# Patient Record
Sex: Female | Born: 1960 | Race: White | Hispanic: No | Marital: Single | Smoking: Never smoker
Health system: Southern US, Community
[De-identification: ages and names within clinical notes are randomized; demographics above are authoritative.]

## PROBLEM LIST (undated history)

## (undated) DIAGNOSIS — M1990 Unspecified osteoarthritis, unspecified site: Secondary | ICD-10-CM

## (undated) DIAGNOSIS — K219 Gastro-esophageal reflux disease without esophagitis: Secondary | ICD-10-CM

## (undated) DIAGNOSIS — E559 Vitamin D deficiency, unspecified: Secondary | ICD-10-CM

## (undated) DIAGNOSIS — D649 Anemia, unspecified: Secondary | ICD-10-CM

## (undated) DIAGNOSIS — Z9889 Other specified postprocedural states: Secondary | ICD-10-CM

## (undated) DIAGNOSIS — M8580 Other specified disorders of bone density and structure, unspecified site: Secondary | ICD-10-CM

## (undated) DIAGNOSIS — T8859XA Other complications of anesthesia, initial encounter: Secondary | ICD-10-CM

## (undated) DIAGNOSIS — J45909 Unspecified asthma, uncomplicated: Secondary | ICD-10-CM

## (undated) HISTORY — PX: TONSILLECTOMY: SUR1361

## (undated) HISTORY — PX: ADENOIDECTOMY: SUR15

## (undated) HISTORY — PX: KNEE ARTHROSCOPY: SUR90

## (undated) HISTORY — DX: Vitamin D deficiency, unspecified: E55.9

## (undated) HISTORY — DX: Unspecified asthma, uncomplicated: J45.909

## (undated) HISTORY — PX: COLONOSCOPY: SHX174

## (undated) HISTORY — DX: Other specified disorders of bone density and structure, unspecified site: M85.80

---

## 1994-12-02 DIAGNOSIS — J189 Pneumonia, unspecified organism: Secondary | ICD-10-CM

## 1994-12-02 HISTORY — DX: Pneumonia, unspecified organism: J18.9

## 1998-07-06 ENCOUNTER — Other Ambulatory Visit: Admission: RE | Admit: 1998-07-06 | Discharge: 1998-07-06 | Payer: Self-pay | Admitting: Obstetrics and Gynecology

## 1999-05-10 ENCOUNTER — Ambulatory Visit (HOSPITAL_COMMUNITY): Admission: RE | Admit: 1999-05-10 | Discharge: 1999-05-10 | Payer: Self-pay | Admitting: Internal Medicine

## 1999-07-16 ENCOUNTER — Other Ambulatory Visit: Admission: RE | Admit: 1999-07-16 | Discharge: 1999-07-16 | Payer: Self-pay | Admitting: Obstetrics and Gynecology

## 2000-07-25 ENCOUNTER — Other Ambulatory Visit: Admission: RE | Admit: 2000-07-25 | Discharge: 2000-07-25 | Payer: Self-pay | Admitting: Obstetrics and Gynecology

## 2000-07-25 ENCOUNTER — Encounter (INDEPENDENT_AMBULATORY_CARE_PROVIDER_SITE_OTHER): Payer: Self-pay

## 2001-08-19 ENCOUNTER — Other Ambulatory Visit: Admission: RE | Admit: 2001-08-19 | Discharge: 2001-08-19 | Payer: Self-pay | Admitting: Obstetrics and Gynecology

## 2004-03-27 ENCOUNTER — Other Ambulatory Visit: Admission: RE | Admit: 2004-03-27 | Discharge: 2004-03-27 | Payer: Self-pay | Admitting: Obstetrics and Gynecology

## 2004-04-02 ENCOUNTER — Encounter: Admission: RE | Admit: 2004-04-02 | Discharge: 2004-04-02 | Payer: Self-pay | Admitting: Obstetrics and Gynecology

## 2005-02-05 ENCOUNTER — Other Ambulatory Visit: Admission: RE | Admit: 2005-02-05 | Discharge: 2005-02-05 | Payer: Self-pay | Admitting: Obstetrics and Gynecology

## 2005-05-08 ENCOUNTER — Other Ambulatory Visit: Admission: RE | Admit: 2005-05-08 | Discharge: 2005-05-08 | Payer: Self-pay | Admitting: Obstetrics and Gynecology

## 2005-07-24 ENCOUNTER — Other Ambulatory Visit: Admission: RE | Admit: 2005-07-24 | Discharge: 2005-07-24 | Payer: Self-pay | Admitting: Obstetrics and Gynecology

## 2006-02-27 ENCOUNTER — Other Ambulatory Visit: Admission: RE | Admit: 2006-02-27 | Discharge: 2006-02-27 | Payer: Self-pay | Admitting: Obstetrics and Gynecology

## 2007-04-21 ENCOUNTER — Ambulatory Visit: Payer: Self-pay | Admitting: Internal Medicine

## 2007-04-21 LAB — CONVERTED CEMR LAB: FSH: 15.2 milliintl units/mL

## 2007-06-12 ENCOUNTER — Encounter: Admission: RE | Admit: 2007-06-12 | Discharge: 2007-06-12 | Payer: Self-pay | Admitting: Gastroenterology

## 2007-12-03 HISTORY — PX: PARTIAL HYSTERECTOMY: SHX80

## 2008-08-03 ENCOUNTER — Encounter (INDEPENDENT_AMBULATORY_CARE_PROVIDER_SITE_OTHER): Payer: Self-pay | Admitting: Obstetrics and Gynecology

## 2008-08-03 ENCOUNTER — Ambulatory Visit (HOSPITAL_COMMUNITY): Admission: RE | Admit: 2008-08-03 | Discharge: 2008-08-04 | Payer: Self-pay | Admitting: Obstetrics and Gynecology

## 2008-12-02 HISTORY — PX: BACK SURGERY: SHX140

## 2009-12-15 ENCOUNTER — Ambulatory Visit: Payer: Self-pay | Admitting: Vascular Surgery

## 2010-02-06 ENCOUNTER — Ambulatory Visit: Payer: Self-pay | Admitting: Vascular Surgery

## 2011-04-16 NOTE — Discharge Summary (Signed)
NAMESHEMEKIA, PATANE          ACCOUNT NO.:  [id number]   MEDICAL RECORD NO.:  [id number]          PATIENT TYPE:  OIB   LOCATION:  9311                          FACILITY:  [facility]   PHYSICIAN:  Guy Sandifer. Henderson Cloud, M.D. DATE OF BIRTH:  1961/01/25   DATE OF ADMISSION:  08/03/2008  DATE OF DISCHARGE:  08/04/2008                               DISCHARGE SUMMARY   ADMITTING DIAGNOSIS:  Uterine leiomyomata.   DISCHARGE DIAGNOSIS:  Uterine leiomyomata.   PROCEDURE:  On August 03, 2008, laparoscopically-assisted vaginal  hysterectomy.   REASON FOR ADMISSION:  The patient is a 50 year old single white female,  G0, P0 with increasingly symptomatic uterine leiomyomata.  Details  dictated in the history and physical.  She is admitted for surgical  management.   HOSPITAL COURSE:  The patient is admitted to the hospital and undergoes  the above procedure.  Estimated blood loss was 100 mL.  On the evening  of surgery, she has good pain relief.  Vital signs are stable.  She is  afebrile with a clear urine output.  On the day of discharge; she is  ambulating, tolerating a regular diet, and voiding.  Vital signs are  stable and she is afebrile.  Hemoglobin is 10.9 and pathology is  pending.   CONDITION ON DISCHARGE:  Good.   DIET:  Regular as tolerated.   ACTIVITY:  No lifting, no operation of automobiles, and no vaginal  entry.   She is to call the office for problems including not limited to  temperature of 101 degrees, heavy vaginal bleeding, increasing pain, or  persistent nausea and vomiting.   MEDICATIONS:  1. Percocet 5/325 mg #30 1-2 p.o. q.6 h p.r.n., no refills.  2. Cataflam b.i.d. as she took preoperatively for her back.  3. Multivitamin daily.   FOLLOWUP:  In the office in 2 weeks.      Guy Sandifer Henderson Cloud, M.D.  Electronically Signed     JET/MEDQ  D:  08/04/2008  T:  08/04/2008  Job:  [id number]

## 2011-04-16 NOTE — Op Note (Signed)
Allison Beck, Allison Beck          ACCOUNT NO.:  [id number]   MEDICAL RECORD NO.:  [id number]          PATIENT TYPE:  OIB   LOCATION:  9311                          [facility]ILITY[facility]   PHYSICIAN:  Guy Sandifer. Henderson Cloud, M.D. DATE OF BIRTH:  26-Nov-1961   DATE OF PROCEDURE:  08/03/2008  DATE OF DISCHARGE:                               OPERATIVE REPORT   PREOPERATIVE DIAGNOSIS:  Uterine leiomyomata.   POSTOPERATIVE DIAGNOSIS:  Uterine leiomyomata.   PROCEDURE:  Laparoscopically-assisted vaginal hysterectomy.   SURGEON:  Guy Sandifer. Henderson Cloud, MD   ASSISTANT:  Juluis Mire, MD   ANESTHESIA:  General with endotracheal intubation.   ESTIMATED BLOOD LOSS:  100 mL.   SPECIMENS:  Uterus to pathology.   INDICATIONS AND CONSENT:  This patient is a 50 year old single white  female G0, P0 with increasingly symptomatic uterine leiomyomata.  Details are dictated in the history and physical.  Laparoscopically-  assisted vaginal hysterectomy and removal of one or both ovaries, only  if abnormal, is discussed.  Potential risks and complications have been  discussed preoperatively including, but not limited to infection, organ  damage, bleeding requiring transfusion of blood products with possible  HIV and hepatitis acquisition, DVT, PE, pneumonia, fistula formation,  postoperative dyspareunia, and laparotomy.  All questions have been  answered and consent is signed on the chart.   FINDINGS:  Upper abdomen is grossly normal.  There are some filmy  adhesions at the epiploic arcade to the left ovary.  However, the  ovaries are otherwise normal in appearance and free.  Uterus has a 5- x  4-cm intramural leiomyoma.  Anterior and posterior cul-de-sacs are  normal.   PROCEDURE:  The patient was taken to the operating room, where she was  identified and placed in the dorsal supine position.  She was placed in  the dorsal lithotomy position prior to anesthesia secondary to her  history of low back pain.   General anesthesia was then induced via  endotracheal intubation.  The patient was then prepped abdominally and  vaginally.  Bladder was straight catheterized.  Hulka tenaculum was  placed in the uterus as a manipulator and she was draped in a sterile  fashion.  The infraumbilical and suprapubic areas were injected in the  midline with 0.5% plain Marcaine.  An infraumbilical incision was made.  The disposable Veress needle was placed on the first attempt and a  normal syringe and drop tests were noted.  A 2 L of gas were insufflated  under low pressure with good tympany in the right upper quadrant.  Veress needle was removed and a 10/11 XL bladeless disposable trocar  sleeve was placed using direct visualization with the diagnostic  laparoscope.  After placement, the operative laparoscope was used.  A  small suprapubic incision was made in the midline and a 5-mm XL  bladeless disposable trocar sleeve was placed under direct visualization  without difficulty.  The above findings were noted.  Then, using the  EnSeal cautery cutting device, proximal ligaments were taken down to the  level of the vesicouterine peritoneum bilaterally.  Good hemostasis was  maintained.  Vesicouterine peritoneum  was taken down cephalad laterally.  Good hemostasis was again noted.  The suprapubic trocar sleeve was  removed.  All other instruments were removed and attention was turned to  the vagina.  The posterior cul-de-sac was entered sharply with scissors.  Cervix was circumscribed with unipolar cautery.  Mucosa was advanced  sharply and bluntly.  Initially, the handheld EnSeal device was used.  However, this proved to be inadequate and the LigaSure cautery device  was then used to take down the uterosacral ligaments followed by the  bladder pillars, cardinal ligaments, and uterine vessels bilaterally.  Secondary to the narrow vagina, the cervix was cored out under careful  visualization.  This exposed the  larger posterior fundal fibroid.  This  was brought into the field and a myomectomy was done to debulk this.  This then allowed the remainder of the fundus to be delivered  posteriorly in the standard fashion.  The remaining pedicles were taken  down with LigaSure bilaterally.  The pedicle on the left was ligated  with a free tie as well.  Uterosacral ligaments were then plicated at  the vaginal cuff bilaterally with individual sutures of 0-Monocryl.  All  sutures will be 0-Monocryl unless otherwise designated.  Uterosacral  ligaments were then plicated in the midline with a third suture.  Cuff  was closed with figure-of-eight.  Foley catheter was placed in the  bladder and clear urine was noted.  Attention was returned to the  abdomen.  Pneumoperitoneum was reintroduced, and the suprapubic trocar  sleeve was reintroduced under direct visualization.  Irrigation was  carried out.  Minor bleeding at peritoneal edges was controlled with  bipolar cautery.  Inspection of the reduced pneumoperitoneum revealed  continued hemostasis.  All instruments were removed and trocar sleeves  were removed.  A 0-Vicryl suture was used to reapproximate the  subcutaneous tissues and the umbilical incision under good  visualization.  Skin on both incisions was closed with interrupted 2-0  Vicryl.  Dermabond was applied.  All counts were correct, and the  patient was awakened and taken to the recovery room in stable condition.      Guy Sandifer Henderson Cloud, M.D.  Electronically Signed     JET/MEDQ  D:  08/03/2008  T:  08/04/2008  Job:  [id number]

## 2011-04-16 NOTE — H&P (Signed)
NAMELAKASHIA, COLLISON          ACCOUNT NO.:  [id number]   MEDICAL RECORD NO.:  [id number]        PATIENT TYPE:  WOIB   LOCATION:                                [facility]   PHYSICIAN:  Guy Sandifer. Henderson Cloud, M.D. DATE OF BIRTH:  03-07-1961   DATE OF ADMISSION:  08/03/2008  DATE OF DISCHARGE:                              HISTORY & PHYSICAL   DATE OF ADMISSION:  08/03/2008.   CHIEF COMPLAINT:  Uterine leiomyomata.   HISTORY OF PRESENT ILLNESS:  This patient is a 50 year old single white  female G0, P0 with known uterine leiomyomata who is having increasingly  heavy and irregular menses.  Ultrasound in my office on May 03, 2008  revealed a uterus measuring 8.5 x 4.5 x 6.6 cm.  There are at least 2  uterine leiomyomata measuring 5 and 4.9 cm.  The ovaries appeared  normal.  Attempts at controlling her bleeding with both the IUD and the  birth control pill have been unsuccessful.  She also is noted to have a  Pap smear that was atypical in May of this year.  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  After discussion of options, she is  being admitted for laparoscopically-assisted vaginal hysterectomy.  The  ovaries will be retained if they appear normal.  The potential risks and  complications have been discussed preoperatively.   PAST MEDICAL HISTORY:  1. Abnormal Pap smear.  2. Migraine headaches.  3. Asthma.  4. Irritable bowel syndrome.  5. Osteoporosis.  6. Arthritis.   PAST SURGICAL HISTORY:  Arthroscopy.   FAMILY HISTORY:  Positive for heart disease, ulcer disease, thyroid  disease, osteoporosis and arthritis.   MEDICATIONS:  Diclofenac 50 mg, Neurontin 600 mg, pantoprazole 40 mg,  Pulmicort 180 mcg, nortriptyline 30 mg, clonazepam 1 mg.   ALLERGIES:  SULFA AND PENICILLIN LEADING TO UNKNOWN REACTION, MAXAIR  LEADING TO HEART RACING, AVELOX LEADING TO YEAST INFECTIONS.   SOCIAL HISTORY:  Denies tobacco, alcohol or drug abuse.   REVIEW OF SYSTEMS:   NEURO:  Headache as above.  CARDIO:  Denies chest  pain.  PULMONARY:  History of asthma.  GI: History of irritable bowel  syndrome.   PHYSICAL EXAM:  Height 5 feet 2-1/2 inches, weight 124.2 pounds, blood  pressure 110/62.  HEENT: Without thyromegaly.  LUNGS:  Clear to auscultation.  HEART:  Regular rate and rhythm.  BACK:  Without CVA tenderness.  BREASTS:  Without mass, retraction or discharge.  PELVIC EXAM:  Vulva, vagina, and cervix without lesion.  The uterus is  about 10 weeks in size, mobile, nontender.  Adnexa nontender without  masses.  EXTREMITIES:  Grossly within normal limits.  NEUROLOGICAL EXAM:  Grossly within normal limits.   ASSESSMENT:  Uterine leiomyomata.   PLAN:  Laparoscopically-assisted vaginal hysterectomy.      Guy Sandifer Henderson Cloud, M.D.  Electronically Signed     JET/MEDQ  D:  08/02/2008  T:  08/02/2008  Job:  [id number]

## 2011-04-16 NOTE — Consult Note (Signed)
NEW PATIENT CONSULTATION   Allison Beck, Allison Beck  DOB:  06/23/1961                                       02/06/2010  ZOXWR#:[id number]   The patient is a 50 year old female referred by Dr. Elisabeth Most for  possible lower extremity occlusive disease.  The patient gives a history  of multiple complaints regarding her lower and upper extremities.  She  describes a sensation of pins and needles as well as a burning sensation  in both lower extremities, worse on the left than the right, generally  in the thigh and calf area but not involving the feet.  The right side  is more related to the calf area.  These symptoms are not related to  activity or rest.  She has had no history of rest pain or nonhealing  ulcers in the feet or infection.  She states that she had a fusion of L5-  S1 performed in [place] by Dr. Montez Morita, orthopedic surgeon, and that there  were similar symptoms in her legs that she had preoperatively.  She is  scheduled to see him next month.  She works out 3 days a week at [employer]  but does not ambulate more than 1 block she states.   CHRONIC MEDICAL PROBLEMS:  Which are stable:  1. Degenerative joint disease.  2. Asthma, borderline.  3. Negative for diabetes, hypertension, coronary artery disease or      stroke.   FAMILY HISTORY:  Positive for coronary artery disease and stroke in her  mother.  Negative for diabetes.   SOCIAL HISTORY:  She is single, has no children.  Does not work.  She  does not use tobacco or alcohol.   REVIEW OF SYSTEMS:  Positive for heart murmur, chronic bronchitis,  asthma, history of reflux esophagitis with hiatal hernia, urinary  frequency, headaches, arthritis and anemia.  All other systems are  negative.   PHYSICAL EXAM:  Vital signs:  Blood pressure 132/77, heart rate 96,  temperature 97.9.  General:  She is well-developed, well-nourished  female who is in no apparent distress, alert and oriented x3.  HEENT:  EOMs intact.   Conjunctivae normal.  Lungs:  Are clear to auscultation.  No wheezing.  Cardiovascular:  Regular rhythm.  No murmurs.  Carotid  pulses are 3+.  No audible bruits.  No palpable adenopathy.  Abdomen:  Soft, nontender with no masses.  Musculoskeletal:  Free of major  deformities or cyanosis.  Neurological:  Normal.  Skin:  Free of rashes.  Lower extremity:  Exam reveals 3+ femoral, popliteal, dorsalis pedis,  posterior tibial pulses bilaterally with no evidence of ischemia or  infection.   I reviewed her previous vascular lab studies which we interpreted and  she does have essentially normal pressures in both lower extremities  with ABI on the right being 1 and on the left being 0.87.   I do not think her symptoms are consistent with arterial insufficiency  and her vascular lab studies support this.  I think it sounds as though  she has a nerve compression syndrome and she should be reevaluated by  her orthopedic surgeon with possible repeat MRI scan of the back and if  this does not result in answers possibly a neurology consultation.  If I  can be of further assistance please let me know.     Quita Skye  Hart Rochester, M.D.  Electronically Signed   JDL/MEDQ  D:  02/06/2010  T:  02/07/2010  Job:  [id number]   cc:   Lovenia Kim, D.O.

## 2011-09-03 ENCOUNTER — Other Ambulatory Visit: Payer: Self-pay | Admitting: Neurological Surgery

## 2011-09-03 DIAGNOSIS — M5410 Radiculopathy, site unspecified: Secondary | ICD-10-CM

## 2011-09-04 LAB — COMPREHENSIVE METABOLIC PANEL
ALT: 44 — ABNORMAL HIGH
AST: 33
Albumin: 3.9
Alkaline Phosphatase: 54
BUN: 13
Total Bilirubin: 0.6
Total Protein: 6.7

## 2011-09-04 LAB — CBC
HCT: 32 — ABNORMAL LOW
Hemoglobin: 12.9
MCHC: 34.2
MCV: 90.4
Platelets: 257
RDW: 12
RDW: 12.4
WBC: 7.6

## 2011-09-04 LAB — HCG, SERUM, QUALITATIVE: Preg, Serum: NEGATIVE

## 2011-09-12 ENCOUNTER — Other Ambulatory Visit: Payer: Self-pay | Admitting: Neurological Surgery

## 2011-09-12 DIAGNOSIS — M545 Low back pain: Secondary | ICD-10-CM

## 2011-09-17 ENCOUNTER — Ambulatory Visit
Admission: RE | Admit: 2011-09-17 | Discharge: 2011-09-17 | Disposition: A | Discharge status: Home / Self Care | Payer: Self-pay | Source: Ambulatory Visit | Attending: Neurological Surgery | Admitting: Neurological Surgery

## 2011-09-17 DIAGNOSIS — M545 Low back pain: Secondary | ICD-10-CM

## 2012-08-06 ENCOUNTER — Other Ambulatory Visit: Payer: Self-pay | Admitting: Neurological Surgery

## 2012-08-06 DIAGNOSIS — M545 Low back pain: Secondary | ICD-10-CM

## 2012-08-07 ENCOUNTER — Ambulatory Visit
Admission: RE | Admit: 2012-08-07 | Discharge: 2012-08-07 | Disposition: A | Discharge status: Home / Self Care | Payer: No Typology Code available for payment source | Source: Ambulatory Visit | Attending: Neurological Surgery | Admitting: Neurological Surgery

## 2012-08-07 DIAGNOSIS — M545 Low back pain: Secondary | ICD-10-CM

## 2012-08-10 ENCOUNTER — Other Ambulatory Visit: Payer: Self-pay | Admitting: Neurological Surgery

## 2012-08-10 DIAGNOSIS — M545 Low back pain: Secondary | ICD-10-CM

## 2013-05-07 IMAGING — CT CT L SPINE W/O CM
3 of 9 series · 10 of 28 positions shown, 11 images · non-contrast
Comparison: CT scan of the abdomen and pelvis dated 11/20/2011 and
lumbar MRI dated 09/17/2011

CLINICAL DATA: Lumbago.  Tingling in the left leg.

CT LUMBAR SPINE WITHOUT CONTRAST
TECHNIQUE: Multidetector CT imaging of the lumbar spine was
performed without intravenous contrast administration. Multiplanar
CT image reconstructions were also generated.

[Series 2: l spine bone · axial · 0.27mm/px · z∈[-32,+34]mm · 2 of 80 slices shown, 3 images]
[im 27/80  soft-tissue]
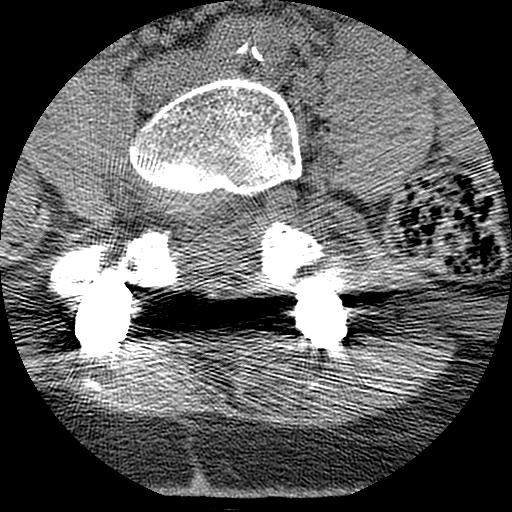
[im 27/80  bone]
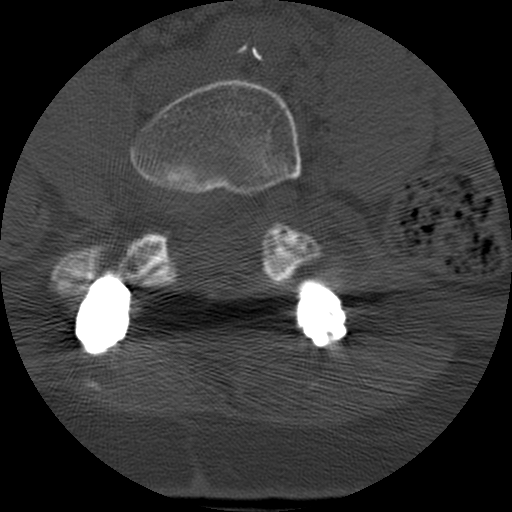
[im 53/80  bone]
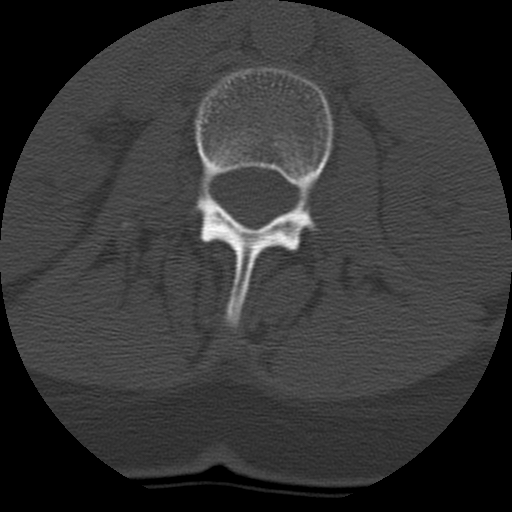

[Series 3: l spine soft · axial · 0.27mm/px · z∈[-49,+51]mm · 3 of 80 slices shown]
[im 20/80  soft-tissue]
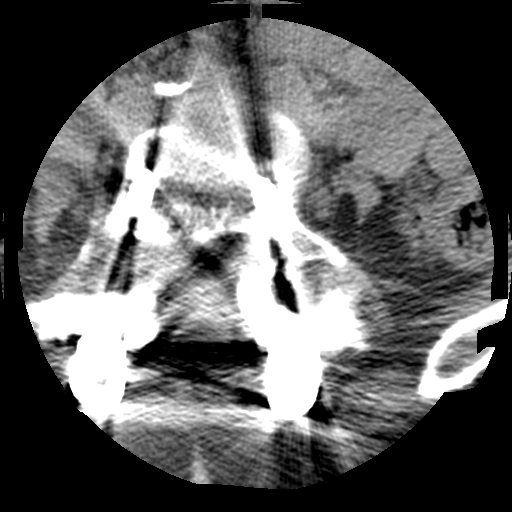
[im 40/80  soft-tissue]
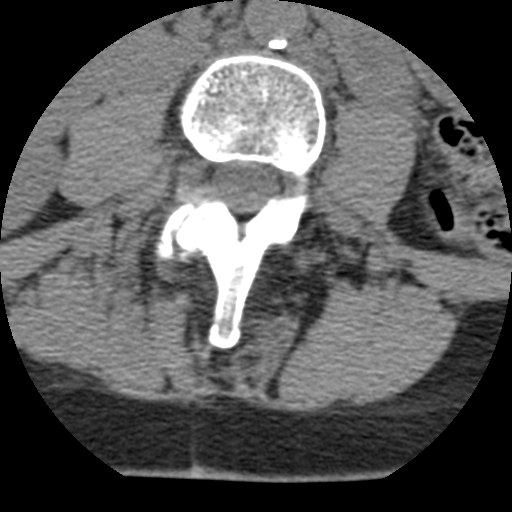
[im 60/80  soft-tissue]
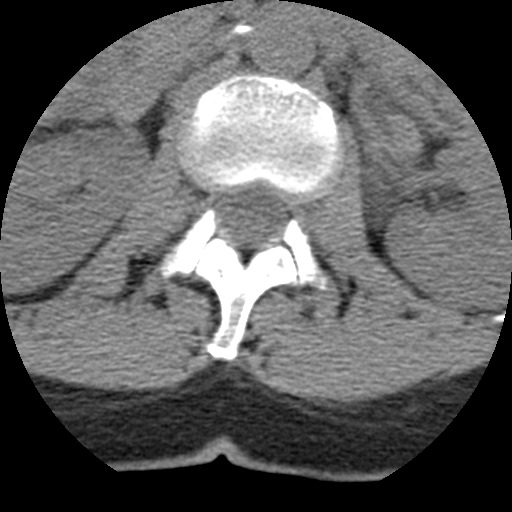

[Series 400: cor · coronal · 0.39mm/px · 5 of 48 slices shown]
[im 8/48  bone]
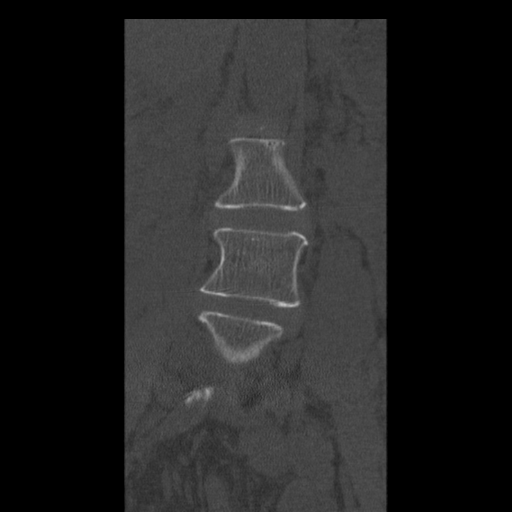
[im 16/48  bone]
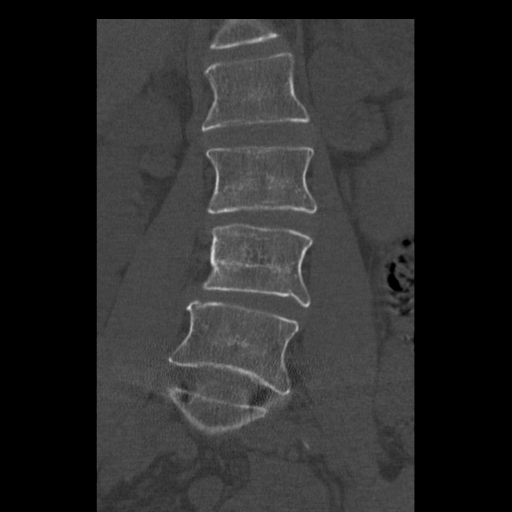
[im 24/48  bone]
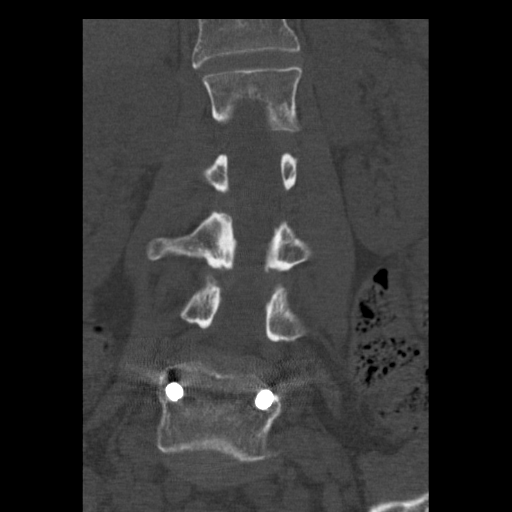
[im 32/48  bone]
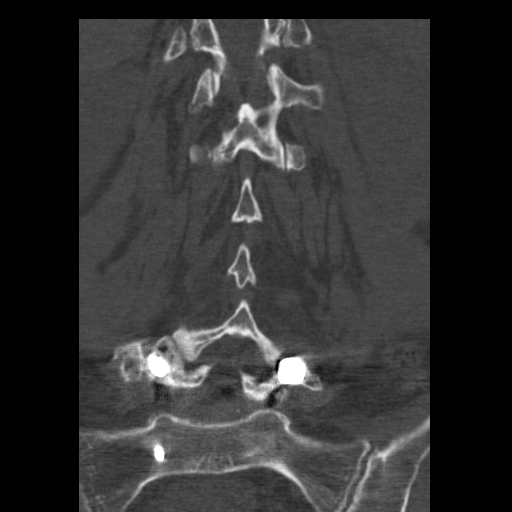
[im 40/48  bone]
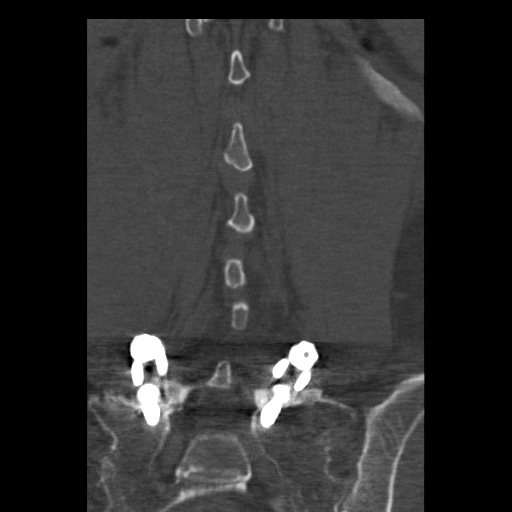

[10 of 28 positions shown; findings below may reference images not displayed]

FINDINGS: T12-L1 through L2-3:  Normal.

L3-4:  There is an asymmetric bulge of the disc into the right
lateral recess and right neural foramen without impingement upon
the exiting [DATE] nerve.  Right lateral recess is slightly
compressed but appears essentially unchanged since the prior CT
scan dated 11/20/2011.  Moderate degenerative changes of the right
facet joint.

L4-5:  Small broad-based disc bulge with no impingement.  Moderate
left facet arthritis.

L5-S1: Solid posterior fusion.  Pedicle screws are in good
position.  Excellent posterior decompression of the thecal sac.
Broad-based slight bulge of the uncovered disc.
IMPRESSION: 1.  Asymmetric bulge of the L3-4 disc with slight narrowing of the
right lateral recess, unchanged since 11/20/2011.
[DATE]. Solid posterior fusion at L5-S1 with no significant residual
impingement.

## 2014-09-26 DIAGNOSIS — J45909 Unspecified asthma, uncomplicated: Secondary | ICD-10-CM | POA: Insufficient documentation

## 2014-09-26 DIAGNOSIS — E559 Vitamin D deficiency, unspecified: Secondary | ICD-10-CM | POA: Insufficient documentation

## 2014-09-26 DIAGNOSIS — M810 Age-related osteoporosis without current pathological fracture: Secondary | ICD-10-CM | POA: Insufficient documentation

## 2014-09-27 ENCOUNTER — Other Ambulatory Visit (HOSPITAL_COMMUNITY)
Admission: RE | Admit: 2014-09-27 | Discharge: 2014-09-27 | Disposition: A | Discharge status: Home / Self Care | Payer: No Typology Code available for payment source | Source: Ambulatory Visit | Attending: Physician Assistant | Admitting: Physician Assistant

## 2014-09-27 ENCOUNTER — Ambulatory Visit (INDEPENDENT_AMBULATORY_CARE_PROVIDER_SITE_OTHER): Payer: No Typology Code available for payment source | Admitting: Physician Assistant

## 2014-09-27 ENCOUNTER — Encounter: Payer: Self-pay | Admitting: Physician Assistant

## 2014-09-27 VITALS — BP 110/60 | HR 76 | Ht 63.0 in

## 2014-09-27 DIAGNOSIS — Z01419 Encounter for gynecological examination (general) (routine) without abnormal findings: Secondary | ICD-10-CM | POA: Diagnosis present

## 2014-09-27 DIAGNOSIS — Z1151 Encounter for screening for human papillomavirus (HPV): Secondary | ICD-10-CM | POA: Insufficient documentation

## 2014-09-27 DIAGNOSIS — R6889 Other general symptoms and signs: Secondary | ICD-10-CM

## 2014-09-27 DIAGNOSIS — Z0001 Encounter for general adult medical examination with abnormal findings: Secondary | ICD-10-CM

## 2014-09-27 DIAGNOSIS — Z23 Encounter for immunization: Secondary | ICD-10-CM

## 2014-09-27 DIAGNOSIS — N898 Other specified noninflammatory disorders of vagina: Secondary | ICD-10-CM

## 2014-09-27 DIAGNOSIS — Z124 Encounter for screening for malignant neoplasm of cervix: Secondary | ICD-10-CM

## 2014-09-27 DIAGNOSIS — N760 Acute vaginitis: Secondary | ICD-10-CM | POA: Diagnosis not present

## 2014-09-27 DIAGNOSIS — E785 Hyperlipidemia, unspecified: Secondary | ICD-10-CM

## 2014-09-27 MED ORDER — AMITRIPTYLINE HCL 50 MG PO TABS: 50.0000 mg | ORAL_TABLET | Freq: Every day | ORAL | Status: DC

## 2014-09-27 NOTE — Patient Instructions (Signed)

## 2014-09-27 NOTE — Progress Notes (Signed)
Complete Physical  Assessment and Plan: Asthma- remission/controlled  Vitamin D deficiency- check level  Osteoporosis- continue follow up Dr. Huntley Decomlin, cont vitamin D/Ca, weight bearing exercises.   Chronic back pain PAP- + discharge check BV/yeast, will send results to Dr. Huntley Decomlin Health Maintenance  Discussed med's effects and SE's. Screening labs and tests as requested with regular follow-up as recommended.  HPI  53 y.o. female  presents for a complete physical.   Her blood pressure has been controlled at home, today their BP is BP: 110/60 mmHg She does workout. She denies chest pain, shortness of breath, dizziness.  She is not on cholesterol medication and denies myalgias. Her cholesterol is at goal. The cholesterol last visit was:   Patient is on Vitamin D supplement.  Has osteoporosis, not on any meds, follows with Dr. Huntley Decomlin.   Current Medications:  No current outpatient prescriptions on file prior to visit.   No current facility-administered medications on file prior to visit.   Health Maintenance:   Tetanus: unknown DUE Pneumovax: Flu vaccine:declines Zostavax: LMP: P. Hysterectomy 2009 Pap: Dr. Huntley Decomlin  Nov 2014 MGM: 2014 DEXA: nov 2013 with Dr. Huntley Dectomlin Colonoscopy: 09/2009 AT [facility], Dr. Jessee Aversellon EGD: Last Dental Exam: Dr. Dahlia ClientHannah Last Eye Exam: Dr. Ezequiel Kayserune  Patient Care Team: Lucky CowboyWilliam McKeown, MD as PCP - General (Internal Medicine) Pryor OchoaJames D Lawson, MD as Consulting Physician (Vascular Surgery) Leslie AndreaJames E Tomblin II, MD as Consulting Physician (Obstetrics and Gynecology) Tia Alertavid S Jones, MD as Consulting Physician (Neurosurgery) Petra KubaMarc E Magod, MD as Consulting Physician (Gastroenterology)  Allergies:  Allergies  Allergen Reactions  . Asa [Aspirin] Shortness Of Breath  . Sulfa Antibiotics Rash   Medical History:  Past Medical History  Diagnosis Date  . Asthma   . Vitamin D deficiency   . Osteopenia     2013 DEXA   Surgical History:  Past Surgical History  Procedure  Laterality Date  . Back surgery  2010    Fusion   . Tonsillectomy      Childhood  . Knee arthroscopy Right   . Partial hysterectomy  2009   Family History:  Family History  Problem Relation Age of Onset  . Heart disease Mother   . Stroke Mother   . Depression Mother   . Hypertension Mother   . Alzheimer's disease Father   . Hypertension Sister    Social History:  History  Substance Use Topics  . Smoking status: Never Smoker   . Smokeless tobacco: Never Used  . Alcohol Use: No   Review of Systems: [X]  = complains of  [ ]  = denies  General: Fatigue [ ]  Fever [ ]  Chills [ ]  Weakness [ ]   Insomnia [ ] Weight change [ ]  Night sweats [ ]   Change in appetite [ ]  Head: Head Trauma [ ]  Eyes: Wears glasses or corrective lens [ ]  Redness [ ]  Blurred vision [ ]  Diplopia [ ]  Discharge [ ]  Floaters [ ]  NWG:NFAOZHYENT:Earache [ ]  hearing loss [ ]  Tinnitus [ ]  Ear Discharge [ ]   Congestion [ ]  Sinus Pain [ ]  Post Nasal Drip [ ]  Nose Bleeds [ ]  Rhinorrhea [ ]    Difficulty Swallowing [ ]  Snoring [ ]  Sore Throat [ ]  Cardiac:   Chest pain/pressure [ ]  SOB [ ]  Orthopnea [ ]   Palpitations [ ]   Paroxysmal nocturnal dyspnea[ ]  Claudication [ ]  Edema [ ]  Difficulty walking around block or climbing stairs [ ]  Pulmonary: Cough [ ]  Wheezing[ ]   SOB [ ]   Pleurisy [ ]   Asthma [ ]  GI: Nausea [ ]  Vomiting[ ]  Dysphagia[ ]  Heartburn[ ]  Abdominal pain [ ]  Constipation [ ] ; Diarrhea [ ]  BRBPR [ ]  Melena[ ]  Bloating [ ]  Hemorrhoids [ ]  Incontinence [ ]  GU: Hematuria[ ]  Dysuria [ ]  Nocturia[ ]  Urgency [ ]   Hesitancy [ ]  Discharge [ ]  Frequency [ ]  Incontinence [ ]  Breast:  Dimpling [ ]  Breast lumps [ ]   Breast Lesions [ ]  Nipple discharge [ ]    Neuro: Headaches[ ]  Vertigo[ ]  Paresthesias[ ]  Spasm [ ]  Speech changes [ ]  Incoordination [ ]  Dizziness [ ]  Numbness [ ]  Ortho: Arthritis [ ]  Joint pain [ ]  Muscle pain [ ]  Joint swelling [ ]  Back Pain [ ]  Weakness [ ]  Stiffness [ ]  Skin:  Rash [ ]   Pruritis [ ]  Change in skin lesion [  ] Change in hair [ ]  Change in nails [ ]  Psych: Depression[ ]  Anxiety[ ]  Stress [ ]  Confusion [ ]  Memory loss [ ]   Heme/Lymph: Bleeding [ ]  Bruising [ ]  History of anemia [ ]  Enlarged lymph nodes [ ]   Endocrine: Visual blurring [ ]  Paresthesia [ ]  Polyuria [ ]  Polydipsia [ ]  Polyphagia [ ]   Heat/cold intolerance [ ]  Hypoglycemia [ ]  Thyroid Issues [ ]  Diabetes [ ]   Physical Exam: There is no weight on file to calculate BMI. BP 110/60  Pulse 76  Ht 5\' 3"  (1.6 m) General Appearance: Well nourished, in no apparent distress.  Eyes: PERRLA, EOMs, conjunctiva no swelling or erythema, normal fundi and vessels.  Sinuses: No Frontal/maxillary tenderness  ENT/Mouth: Ext aud canals clear, normal light reflex with TMs without erythema, bulging. Good dentition. No erythema, swelling, or exudate on post pharynx. Tonsils not swollen or erythematous. Hearing normal.  Neck: Supple, thyroid normal. No bruits  Respiratory: Respiratory effort normal, BS equal bilaterally without rales, rhonchi, wheezing or stridor.  Cardio: RRR without murmurs, rubs or gallops. Brisk peripheral pulses without edema.  Chest: symmetric, with normal excursions and percussion.  Breasts: Symmetric, without lumps, nipple discharge, retractions.  Abdomen: Soft, nontender, no guarding, rebound, hernias, masses, or organomegaly. .  Lymphatics: Non tender without lymphadenopathy.  Genitourinary: VULVA: normal appearing vulva with no masses, tenderness or lesions, VAGINA: atrophic, CERVIX: lesions absent, cervical discharge present - white, copious and creamy, PAP: Pap smear done today. Musculoskeletal: Full ROM all peripheral extremities,5/5 strength, and normal gait.  Skin: Warm, dry without rashes, lesions, ecchymosis. Neuro: Cranial nerves intact, reflexes equal bilaterally. Normal muscle tone, no cerebellar symptoms. Sensation intact.  Psych: Awake and oriented X 3, normal affect, Insight and Judgment appropriate.   EKG: WNL no  changes.   Quentin Mullingollier, Sedric Guia 3:33 PM [facility]

## 2014-09-28 LAB — BASIC METABOLIC PANEL WITH GFR
BUN: 20 mg/dL (ref 6–23)
CALCIUM: 10.2 mg/dL (ref 8.4–10.5)
CO2: 27 mEq/L (ref 19–32)
Chloride: 99 mEq/L (ref 96–112)
Creat: 0.74 mg/dL (ref 0.50–1.10)
GFR, Est African American: >89 mL/min
GFR, Est Non African American: >89 mL/min
Glucose, Bld: 89 mg/dL (ref 70–99)
Potassium: 4.1 mEq/L (ref 3.5–5.3)
SODIUM: 139 meq/L (ref 135–145)

## 2014-09-28 LAB — IRON AND TIBC
%SAT: 16 % — ABNORMAL LOW (ref 20–55)
IRON: 55 ug/dL (ref 42–145)
TIBC: 339 ug/dL (ref 250–470)
UIBC: 284 ug/dL (ref 125–400)

## 2014-09-28 LAB — LIPID PANEL
CHOL/HDL RATIO: 2.3 ratio
Cholesterol: 196 mg/dL (ref 0–200)
HDL: 86 mg/dL (ref >39)
LDL Cholesterol: 97 mg/dL (ref 0–99)
Triglycerides: 65 mg/dL (ref <150)
VLDL: 13 mg/dL (ref 0–40)

## 2014-09-28 LAB — HEMOGLOBIN A1C
HEMOGLOBIN A1C: 5.2 % (ref <5.7)
MEAN PLASMA GLUCOSE: 103 mg/dL (ref <117)

## 2014-09-28 LAB — HEPATIC FUNCTION PANEL
ALT: 29 U/L (ref 0–35)
AST: 30 U/L (ref 0–37)
Albumin: 4.9 g/dL (ref 3.5–5.2)
Alkaline Phosphatase: 58 U/L (ref 39–117)
BILIRUBIN DIRECT: 0.1 mg/dL (ref 0.0–0.3)
Indirect Bilirubin: 0.3 mg/dL (ref 0.2–1.2)
Total Bilirubin: 0.4 mg/dL (ref 0.2–1.2)
Total Protein: 7.3 g/dL (ref 6.0–8.3)

## 2014-09-28 LAB — INSULIN, FASTING: INSULIN FASTING, SERUM: 4.5 u[IU]/mL (ref 2.0–19.6)

## 2014-09-28 LAB — CBC WITH DIFFERENTIAL/PLATELET
BASOS ABS: 0.1 10*3/uL (ref 0.0–0.1)
Basophils Relative: 1 % (ref 0–1)
Eosinophils Absolute: 0.3 10*3/uL (ref 0.0–0.7)
Eosinophils Relative: 5 % (ref 0–5)
HEMATOCRIT: 42.6 % (ref 36.0–46.0)
Hemoglobin: 14.4 g/dL (ref 12.0–15.0)
LYMPHS ABS: 1.2 10*3/uL (ref 0.7–4.0)
LYMPHS PCT: 23 % (ref 12–46)
MCH: 29.8 pg (ref 26.0–34.0)
MCHC: 33.8 g/dL (ref 30.0–36.0)
MCV: 88 fL (ref 78.0–100.0)
MONO ABS: 0.6 10*3/uL (ref 0.1–1.0)
Monocytes Relative: 12 % (ref 3–12)
Neutro Abs: 3.1 10*3/uL (ref 1.7–7.7)
Neutrophils Relative %: 59 % (ref 43–77)
Platelets: 311 10*3/uL (ref 150–400)
RBC: 4.84 MIL/uL (ref 3.87–5.11)
RDW: 12.9 % (ref 11.5–15.5)
WBC: 5.2 10*3/uL (ref 4.0–10.5)

## 2014-09-28 LAB — URINALYSIS, ROUTINE W REFLEX MICROSCOPIC
BILIRUBIN URINE: NEGATIVE
GLUCOSE, UA: NEGATIVE mg/dL
Hgb urine dipstick: NEGATIVE
KETONES UR: NEGATIVE mg/dL
Leukocytes, UA: NEGATIVE
NITRITE: NEGATIVE
PH: 7 (ref 5.0–8.0)
Protein, ur: NEGATIVE mg/dL
Specific Gravity, Urine: <1.005 — ABNORMAL LOW (ref 1.005–1.030)
Urobilinogen, UA: 0.2 mg/dL (ref 0.0–1.0)

## 2014-09-28 LAB — VITAMIN B12: Vitamin B-12: >2000 pg/mL — ABNORMAL HIGH (ref 211–911)

## 2014-09-28 LAB — VITAMIN D 25 HYDROXY (VIT D DEFICIENCY, FRACTURES): VIT D 25 HYDROXY: 81 ng/mL (ref 30–89)

## 2014-09-28 LAB — MAGNESIUM: Magnesium: 2.4 mg/dL (ref 1.5–2.5)

## 2014-09-28 LAB — TSH: TSH: 3.878 u[IU]/mL (ref 0.350–4.500)

## 2014-09-28 LAB — FERRITIN: Ferritin: 58 ng/mL (ref 10–291)

## 2014-09-29 LAB — CYTOLOGY - PAP

## 2014-10-03 LAB — CERVICOVAGINAL ANCILLARY ONLY
Bacterial vaginitis: NEGATIVE
Candida vaginitis: NEGATIVE

## 2015-10-03 ENCOUNTER — Ambulatory Visit (INDEPENDENT_AMBULATORY_CARE_PROVIDER_SITE_OTHER): Payer: No Typology Code available for payment source | Admitting: Physician Assistant

## 2015-10-03 ENCOUNTER — Encounter: Payer: Self-pay | Admitting: Physician Assistant

## 2015-10-03 VITALS — BP 100/60 | HR 90 | Temp 98.1°F | Resp 14 | Ht 63.0 in | Wt 116.0 lb

## 2015-10-03 DIAGNOSIS — M8580 Other specified disorders of bone density and structure, unspecified site: Secondary | ICD-10-CM | POA: Diagnosis not present

## 2015-10-03 DIAGNOSIS — E559 Vitamin D deficiency, unspecified: Secondary | ICD-10-CM | POA: Diagnosis not present

## 2015-10-03 DIAGNOSIS — M5442 Lumbago with sciatica, left side: Secondary | ICD-10-CM

## 2015-10-03 DIAGNOSIS — J4520 Mild intermittent asthma, uncomplicated: Secondary | ICD-10-CM | POA: Diagnosis not present

## 2015-10-03 DIAGNOSIS — M5441 Lumbago with sciatica, right side: Secondary | ICD-10-CM | POA: Diagnosis not present

## 2015-10-03 DIAGNOSIS — M545 Low back pain, unspecified: Secondary | ICD-10-CM | POA: Insufficient documentation

## 2015-10-03 MED ORDER — AMITRIPTYLINE HCL 50 MG PO TABS: 50.0000 mg | ORAL_TABLET | Freq: Every day | ORAL | Status: DC

## 2015-10-03 NOTE — Patient Instructions (Signed)
What is the TMJ? The temporomandibular (tem-PUH-ro-man-DIB-yoo-ler) joint, or the TMJ, connects the upper and lower jawbones. This joint allows the jaw to open wide and move back and forth when you chew, talk, or yawn.There are also several muscles that help this joint move. There can be muscle tightness and pain in the muscle that can cause several symptoms.  What causes TMJ pain? There are many causes of TMJ pain. Repeated chewing (for example, chewing gum) and clenching your teeth can cause pain in the joint. Some TMJ pain has no obvious cause. What can I do to ease the pain? There are many things you can do to help your pain get better. When you have pain:  Eat soft foods and stay away from chewy foods (for example, taffy) Try to use both sides of your mouth to chew Don't chew gum Massage Don't open your mouth wide (for example, during yawning or singing) Don't bite your cheeks or fingernails Lower your amount of stress and worry Applying a warm, damp washcloth to the joint may help. Over-the-counter pain medicines such as ibuprofen (one brand: Advil) or acetaminophen (one brand: Tylenol) might also help. Do not use these medicines if you are allergic to them or if your doctor told you not to use them. How can I stop the pain from coming back? When your pain is better, you can do these exercises to make your muscles stronger and to keep the pain from coming back:  Resisted mouth opening: Place your thumb or two fingers under your chin and open your mouth slowly, pushing up lightly on your chin with your thumb. Hold for three to six seconds. Close your mouth slowly. Resisted mouth closing: Place your thumbs under your chin and your two index fingers on the ridge between your mouth and the bottom of your chin. Push down lightly on your chin as you close your mouth. Tongue up: Slowly open and close your mouth while keeping the tongue touching the roof of the mouth. Side-to-side jaw movement:  Place an object about one fourth of an inch thick (for example, two tongue depressors) between your front teeth. Slowly move your jaw from side to side. Increase the thickness of the object as the exercise becomes easier Forward jaw movement: Place an object about one fourth of an inch thick between your front teeth and move the bottom jaw forward so that the bottom teeth are in front of the top teeth. Increase the thickness of the object as the exercise becomes easier. These exercises should not be painful. If it hurts to do these exercises, stop doing them and talk to your family doctor.     

## 2015-10-03 NOTE — Progress Notes (Signed)
Assessment and Plan:  1. Bilateral low back pain with sciatica, sciatica laterality unspecified Elavil refill  2. Asthma, mild intermittent, uncomplicated controlled  3. Osteopenia Continue vitamin d, gets DEXA this year  4. Vitamin D deficiency Continue supplement   Continue diet and meds as discussed. Further disposition pending results of labs. Over 30 minutes of exam, counseling, chart review, and critical decision making was performed  HPI 54 y.o. female  presents for 3 month follow up on hypertension, cholesterol, prediabetes, and vitamin D deficiency.   Her blood pressure has been controlled at home, today their BP is BP: 100/60 mmHg  She does workout. She denies chest pain, shortness of breath, dizziness.  She is not on cholesterol medication and denies myalgias. Her cholesterol is at goal. The cholesterol last visit was:   Lab Results  Component Value Date   CHOL 196 09/27/2014   HDL 86 09/27/2014   LDLCALC 97 09/27/2014   TRIG 65 09/27/2014   CHOLHDL 2.3 09/27/2014    Last A1C in the office was:  Lab Results  Component Value Date   HGBA1C 5.2 09/27/2014   Patient is on Vitamin D supplement.   Lab Results  Component Value Date   VD25OH 2881 09/27/2014     She follows with Dr. Huntley Decomlin, and has CPE in dec with him, will do labs there.  Doing well since on Shaklee products.  She is on elavil from lumbar fusion, takes at night.   Current Medications:  Current Outpatient Prescriptions on File Prior to Visit  Medication Sig Dispense Refill  . amitriptyline (ELAVIL) 50 MG tablet Take 1 tablet (50 mg total) by mouth at bedtime. 90 tablet 3   No current facility-administered medications on file prior to visit.   Medical History:  Past Medical History  Diagnosis Date  . Asthma   . Vitamin D deficiency   . Osteopenia     2013 DEXA   Allergies:  Allergies  Allergen Reactions  . Asa [Aspirin] Shortness Of Breath  . Sulfa Antibiotics Rash     Review of  Systems:  Review of Systems  Constitutional: Negative.   HENT: Negative.   Eyes: Negative.   Respiratory: Negative.   Cardiovascular: Negative.   Gastrointestinal: Negative.   Genitourinary: Negative.   Musculoskeletal: Negative.   Skin: Negative.   Neurological: Negative.   Endo/Heme/Allergies: Negative.   Psychiatric/Behavioral: Negative.     Family history- Review and unchanged Social history- Review and unchanged Physical Exam: BP 100/60 mmHg  Pulse 90  Temp(Src) 98.1 F (36.7 C) (Temporal)  Resp 14  Ht 5\' 3"  (1.6 m)  Wt 116 lb (52.617 kg)  BMI 20.55 kg/m2  SpO2 99% Wt Readings from Last 3 Encounters:  10/03/15 116 lb (52.617 kg)   General Appearance: Well nourished, in no apparent distress. Eyes: PERRLA, EOMs, conjunctiva no swelling or erythema Sinuses: No Frontal/maxillary tenderness ENT/Mouth: Ext aud canals clear, TMs without erythema, bulging. No erythema, swelling, or exudate on post pharynx.  Tonsils not swollen or erythematous. Hearing normal.  Neck: Supple, thyroid normal.  Respiratory: Respiratory effort normal, BS equal bilaterally without rales, rhonchi, wheezing or stridor.  Cardio: RRR with no MRGs. Brisk peripheral pulses without edema.  Abdomen: Soft, + BS,  Non tender, no guarding, rebound, hernias, masses. Lymphatics: Non tender without lymphadenopathy.  Musculoskeletal: Full ROM, 5/5 strength, Normal gait Skin: Warm, dry without rashes, lesions, ecchymosis.  Neuro: Cranial nerves intact. Normal muscle tone, no cerebellar symptoms. Psych: Awake and oriented X 3, normal affect,  Insight and Judgment appropriate.    Quentin Mulling, PA-C 2:48 PM [facility]

## 2015-10-04 ENCOUNTER — Ambulatory Visit: Payer: Self-pay | Admitting: Physician Assistant

## 2015-10-04 ENCOUNTER — Encounter: Payer: Self-pay | Admitting: Physician Assistant

## 2015-10-20 ENCOUNTER — Other Ambulatory Visit: Payer: Self-pay | Admitting: Physician Assistant

## 2016-03-13 ENCOUNTER — Ambulatory Visit (INDEPENDENT_AMBULATORY_CARE_PROVIDER_SITE_OTHER): Payer: No Typology Code available for payment source | Admitting: Allergy and Immunology

## 2016-03-13 ENCOUNTER — Encounter: Payer: Self-pay | Admitting: Allergy and Immunology

## 2016-03-13 VITALS — BP 110/88 | HR 92 | Temp 98.0°F | Resp 18 | Ht 61.61 in | Wt 120.8 lb

## 2016-03-13 DIAGNOSIS — J4530 Mild persistent asthma, uncomplicated: Secondary | ICD-10-CM | POA: Diagnosis not present

## 2016-03-13 MED ORDER — ALBUTEROL SULFATE 108 (90 BASE) MCG/ACT IN AEPB: 2.0000 | INHALATION_SPRAY | RESPIRATORY_TRACT | Status: DC | PRN

## 2016-03-13 MED ORDER — FLUTICASONE FUROATE 100 MCG/ACT IN AEPB: 1.0000 | INHALATION_SPRAY | Freq: Every day | RESPIRATORY_TRACT | Status: DC

## 2016-03-13 NOTE — Patient Instructions (Signed)
  1. Start anti-inflammatory medication including:   A. Arnuity 100 one inhalation 1 time per day  2. If needed:   A. pro-air respiclick 2 puffs every 4-6 hours  3. Return to clinic in 4 weeks or earlier if problem

## 2016-03-13 NOTE — Progress Notes (Signed)
Dear Dr. Oneta Rack,  Thank you for referring Allison Beck to the [facility] [place] on 03/13/2016.   Below is a summation of this patient's evaluation and recommendations.  Thank you for your referral. I will keep you informed about this patient's response to treatment.   If you have any questions please to do hestitate to contact me.   Sincerely,  Jessica Priest, MD [facility]   ______________________________________________________________________    NEW PATIENT NOTE  Referring Provider: Lucky Cowboy, MD Primary Provider: Nadean Corwin, MD Date of office visit: 03/13/2016    Subjective:   Chief Complaint:  Allison Beck (DOB: 1961/11/07) is a 55 y.o. female with a chief complaint of New Patient (Initial Visit)  who presents to the clinic on 03/13/2016 with the following problems:  HPI Comments: Chakira presents this clinic in evaluation of breathing problems. I have seen Samara Deist in his clinic many years ago for an issue with asthma and allergic rhinitis and LPR. During her last visit with me in 2013 she appeared to have resolution most of her issues with some behavioral modification and an exercise program and came off most of her medications including her inhaled steroid, proton pump inhibitor, and H2 receptor blocker.  Since I've seen her in the clinic she is done very well without any problems involving her lungs and without any problems involving reflux without the use of any specific medications. However, she noticed that whenever she started to laugh sometime starting in December she would develop problems with coughing and wheezing. This winter she developed an upper respiratory tract infection and cough for several weeks. She has a consistent history of developing prolonged cough whenever she develops any type of viral respiratory tract infection. As well, in  December she started Evista for osteoporosis and this temporally correlated with the onset of her coughing and wheezing associated with laughing and she stopped the Evista over the course of the past several weeks but her situation still continues. She does not appear to have any exercise-induced bronchospastic symptoms. She performs Pilates and weight training and cardiovascular training about 4 times per week without any difficulty at all. She does not have a history of cold air induced bronchospastic symptoms over the course of the past several years. She does not use any inhaled steroids or short acting bronchodilators.  Her reflux is basically melted away with behavioral modifications. She does not consume any caffeine and does not consume any chocolate other than an occasional chocolate protein shake. She does not use any medications for her reflux and she has not had any problems with LPR in the form of throat clearing or raspy voice or sensation of drainage or lump in her throat.  She is not been having any problems with allergies affecting her nose. She does not have any nasal congestion or sneezing and she's not had recurrent sinusitis.  She did not obtain the flu vaccine this past year.   Past Medical History  Diagnosis Date  . Asthma   . Vitamin D deficiency   . Osteopenia     2013 DEXA    Past Surgical History  Procedure Laterality Date  . Back surgery  2010    Fusion   . Tonsillectomy      Childhood  . Knee arthroscopy Right   . Partial hysterectomy  2009      Medication List  amitriptyline 50 MG tablet  Commonly known as:  ELAVIL  Take 1 tablet (50 mg total) by mouth at bedtime.     amitriptyline 50 MG tablet  Commonly known as:  ELAVIL  TAKE ONE TABLET BY MOUTH AT BEDTIME     EVISTA 60 MG tablet  Generic drug:  raloxifene  Take 60 mg by mouth daily.        Allergies  Allergen Reactions  . Asa [Aspirin] Shortness Of Breath  . Omnicef  [Cefdinir]   . Penicillins   . Sulfa Antibiotics Rash    Review of systems negative except as noted in HPI / PMHx or noted below:  Review of Systems  Constitutional: Negative.   HENT: Negative.   Eyes: Negative.   Respiratory: Negative.   Cardiovascular: Negative.   Gastrointestinal: Negative.   Genitourinary: Negative.   Musculoskeletal: Negative.   Skin: Negative.   Neurological: Negative.   Endo/Heme/Allergies: Negative.   Psychiatric/Behavioral: Negative.     Family History  Problem Relation Age of Onset  . Heart disease Mother   . Stroke Mother   . Depression Mother   . Hypertension Mother   . Alzheimer's disease Father   . Hypertension Sister     Social History   Social History  . Marital Status: Single    Spouse Name: N/A  . Number of Children: N/A  . Years of Education: N/A   Occupational History  . Not on file.   Social History Main Topics  . Smoking status: Never Smoker   . Smokeless tobacco: Never Used  . Alcohol Use: No  . Drug Use: No  . Sexual Activity: Not on file   Other Topics Concern  . Not on file   Social History Narrative    Environmental and Social history  Lives in a house with a dry environment, carpeting in the bedroom, a dog located inside the household, plastic on both the head and pillow, and she works as a Advertising account planner   Objective:   Filed Vitals:   03/13/16 0939  BP: 128/92  Pulse: 92  Resp: 18   Height: 5' 1.61" (156.5 cm) Weight: 120 lb 13 oz (54.8 kg)  Physical Exam  Constitutional: She is well-developed, well-nourished, and in no distress.  HENT:  Head: Normocephalic. Head is without right periorbital erythema and without left periorbital erythema.  Right Ear: Tympanic membrane, external ear and ear canal normal.  Left Ear: Tympanic membrane, external ear and ear canal normal.  Nose: Nose normal. No mucosal edema or rhinorrhea.  Mouth/Throat: Oropharynx is clear and moist and mucous  membranes are normal. No oropharyngeal exudate.  Eyes: Conjunctivae and lids are normal. Pupils are equal, round, and reactive to light.  Neck: Trachea normal. No tracheal deviation present. No thyromegaly present.  Cardiovascular: Normal rate, regular rhythm, S1 normal, S2 normal and normal heart sounds.   No murmur heard. Pulmonary/Chest: Effort normal. No stridor. No tachypnea. No respiratory distress. She has no wheezes. She has no rales. She exhibits no tenderness.  Abdominal: Soft. She exhibits no distension and no mass. There is no hepatosplenomegaly. There is no tenderness. There is no rebound and no guarding.  Musculoskeletal: She exhibits no edema or tenderness.  Lymphadenopathy:       Head (right side): No tonsillar adenopathy present.       Head (left side): No tonsillar adenopathy present.    She has no cervical adenopathy.    She has no axillary adenopathy.  Neurological: She is  alert. Gait normal.  Skin: No rash noted. She is not diaphoretic. No erythema. No pallor. Nails show no clubbing.  Psychiatric: Mood and affect normal.     Diagnostics:    Spirometry was performed and demonstrated an FEV1 of 2.42 @ 107 % of predicted. Following the administration of nebulized albuterol her FEV1 rose to 2.48 which was an increase in the FEV1 of 2%.  The patient had an Asthma Control Test with the following results:  .     Assessment and Plan:    1. Asthma, well controlled, mild persistent      1. Start anti-inflammatory medication including:   A. Arnuity 100 one inhalation 1 time per day  2. If needed:   A. pro-air respiclick 2 puffs every 4-6 hours  3. Return to clinic in 4 weeks or earlier if problem  I will have Santina EvansCatherine utilize a relatively low dose of an inhaled steroid and we'll see what type of the response we get over the course of the next several weeks and make a decision about how to proceed pending her response. I do not think that she needs to have any  further investigation for her atopic disease or reflux at this point in time unless she does not respond adequately to medical therapy specified above. If she does well on her current dose of inhaled steroid then at some point we'll attempt to consolidate this dose aiming for the least amount of medication that is required to control her disease state.  Jessica PriestEric J. Sheddrick Lattanzio, MD [place] [facility] [place]

## 2016-03-14 ENCOUNTER — Telehealth: Payer: Self-pay | Admitting: Allergy and Immunology

## 2016-03-14 NOTE — Telephone Encounter (Signed)
Pt is feeling better now. 

## 2016-03-14 NOTE — Telephone Encounter (Signed)
Please inform patient that it is possible she has developed a viral infection. This may take several days to resolve. She should see how things are going at the beginning of next week.

## 2016-03-14 NOTE — Telephone Encounter (Signed)
Please advise 

## 2016-03-14 NOTE — Telephone Encounter (Signed)
Pt called and said that since yesterday that she has no energy and chest feels heavy. After she had the breathing treatment. [phone]

## 2016-09-05 ENCOUNTER — Encounter: Payer: Self-pay | Admitting: Physician Assistant

## 2016-09-05 ENCOUNTER — Ambulatory Visit (INDEPENDENT_AMBULATORY_CARE_PROVIDER_SITE_OTHER): Payer: No Typology Code available for payment source | Admitting: Physician Assistant

## 2016-09-05 VITALS — BP 122/78 | HR 97 | Temp 97.9°F | Resp 14 | Ht 63.0 in | Wt 119.0 lb

## 2016-09-05 DIAGNOSIS — E559 Vitamin D deficiency, unspecified: Secondary | ICD-10-CM

## 2016-09-05 DIAGNOSIS — J45909 Unspecified asthma, uncomplicated: Secondary | ICD-10-CM | POA: Diagnosis not present

## 2016-09-05 DIAGNOSIS — M810 Age-related osteoporosis without current pathological fracture: Secondary | ICD-10-CM

## 2016-09-05 DIAGNOSIS — M5440 Lumbago with sciatica, unspecified side: Secondary | ICD-10-CM | POA: Diagnosis not present

## 2016-09-05 MED ORDER — AMITRIPTYLINE HCL 50 MG PO TABS: 50.0000 mg | ORAL_TABLET | Freq: Every day | ORAL | 0 refills | Status: DC

## 2016-09-05 NOTE — Progress Notes (Signed)
Assessment and Plan:  Back pain- refill amitriptyline, will start cutting back on dose to 1/2, and may reduce/stop  Continue diet and meds as discussed. Further disposition pending results of labs. Over 30 minutes of exam, counseling, chart review, and critical decision making was performed  No future appointments.  DECLINES CPE, GETTING WITH DR. Huntley DecMLIN, WILL GET CPE IN 1 YEAR   HPI 55 y.o. female  presents for 3 month follow up on hypertension, cholesterol, prediabetes, and vitamin D deficiency.   Her blood pressure has been controlled at home, today their BP is BP: 122/78   She does workout. She denies chest pain, shortness of breath, dizziness. The cholesterol last visit was:   Lab Results  Component Value Date   CHOL 196 09/27/2014   HDL 86 09/27/2014   LDLCALC 97 09/27/2014   TRIG 65 09/27/2014   CHOLHDL 2.3 09/27/2014    Last A1C in the office was:  Lab Results  Component Value Date   HGBA1C 5.2 09/27/2014   Patient is on Vitamin D supplement.   Lab Results  Component Value Date   VD25OH 3981 09/27/2014       Current Medications:  Current Outpatient Prescriptions on File Prior to Visit  Medication Sig Dispense Refill  . amitriptyline (ELAVIL) 50 MG tablet Take 1 tablet (50 mg total) by mouth at bedtime. 90 tablet 3   No current facility-administered medications on file prior to visit.    Medical History:  Past Medical History:  Diagnosis Date  . Asthma   . Osteopenia    2013 DEXA  . Vitamin D deficiency    Allergies:  Allergies  Allergen Reactions  . Asa [Aspirin] Shortness Of Breath  . Omnicef [Cefdinir]   . Penicillins   . Sulfa Antibiotics Rash     Review of Systems:  Review of Systems  Constitutional: Negative.   HENT: Negative.   Eyes: Negative.   Respiratory: Negative.   Cardiovascular: Negative.   Gastrointestinal: Negative.   Genitourinary: Negative.   Musculoskeletal: Negative.   Skin: Negative.   Neurological: Negative.    Endo/Heme/Allergies: Negative.   Psychiatric/Behavioral: Negative.     Family history- Review and unchanged Social history- Review and unchanged Physical Exam: BP 122/78   Pulse 97   Temp 97.9 F (36.6 C)   Resp 14   Ht 5\' 3"  (1.6 m)   Wt 119 lb (54 kg)   SpO2 98%   BMI 21.08 kg/m  Wt Readings from Last 3 Encounters:  09/05/16 119 lb (54 kg)  03/13/16 120 lb 13 oz (54.8 kg)  10/03/15 116 lb (52.6 kg)   General Appearance: Well nourished, in no apparent distress. Eyes: PERRLA, EOMs, conjunctiva no swelling or erythema Sinuses: No Frontal/maxillary tenderness ENT/Mouth: Ext aud canals clear, TMs without erythema, bulging. No erythema, swelling, or exudate on post pharynx.  Tonsils not swollen or erythematous. Hearing normal.  Neck: Supple, thyroid normal.  Respiratory: Respiratory effort normal, BS equal bilaterally without rales, rhonchi, wheezing or stridor.  Cardio: RRR with no MRGs. Brisk peripheral pulses without edema.  Abdomen: Soft, + BS,  Non tender, no guarding, rebound, hernias, masses. Lymphatics: Non tender without lymphadenopathy.  Musculoskeletal: Full ROM, 5/5 strength, Normal gait Skin: Warm, dry without rashes, lesions, ecchymosis.  Neuro: Cranial nerves intact. Normal muscle tone, no cerebellar symptoms. Psych: Awake and oriented X 3, normal affect, Insight and Judgment appropriate.    Quentin MullingAmanda Daisuke Bailey, PA-C 10:22 AM [facility]

## 2017-03-10 ENCOUNTER — Encounter: Payer: Self-pay | Admitting: *Deleted

## 2017-03-11 LAB — HM PAP SMEAR: HM PAP: NEGATIVE

## 2017-03-27 ENCOUNTER — Telehealth: Payer: Self-pay

## 2017-03-27 NOTE — Telephone Encounter (Signed)
Pt called wanting to know if it she could stop taking amitriptyline. States she has been taking amitriptyline for a while & has cut it down to 25 mgs.  Per provider amitriptyline is not an addictive substance & it is ok to stop.  Pt states that she has 5 pills left & will finish the rest of her pills & if she has any problems she will give the office a call. Pt voiced understanding & agreed & hung up.

## 2017-04-01 ENCOUNTER — Other Ambulatory Visit: Payer: Self-pay

## 2017-04-01 MED ORDER — AMITRIPTYLINE HCL 25 MG PO TABS: ORAL_TABLET | ORAL | 0 refills | Status: DC

## 2017-05-28 ENCOUNTER — Other Ambulatory Visit: Payer: Self-pay | Admitting: Physician Assistant

## 2017-05-28 MED ORDER — AMITRIPTYLINE HCL 50 MG PO TABS: ORAL_TABLET | ORAL | 0 refills | Status: DC

## 2017-07-24 ENCOUNTER — Other Ambulatory Visit: Payer: Self-pay | Admitting: Physician Assistant

## 2017-08-27 NOTE — Progress Notes (Signed)
6 month follow up  Assessment and Plan:  Uncomplicated asthma, unspecified asthma severity, unspecified whether persistent Controlled  Osteoporosis, unspecified osteoporosis type, unspecified pathological fracture presence Continue follow up  Vitamin D deficiency Getting checking Dr. Huntley Dec   Bilateral low back pain with sciatica, sciatica laterality unspecified, unspecified chronicity Continue elavil, continue exericise    Discussed med's effects and SE's. Screening labs and tests as requested with regular follow-up as recommended. Future Appointments Date Time Provider Department Center  02/26/2018 8:45 AM Quentin Mulling, PA-C [id number] None    HPI  56 y.o. female  presents for follow up for back pain.    Her blood pressure has been controlled at home, today their BP is BP: 116/78 She does workout, zumba and water classes at the gym. She denies chest pain, shortness of breath, dizziness. On Shaklee products.  She is not on cholesterol medication and denies myalgias. Her cholesterol is at goal. The cholesterol last visit was:   Lab Results  Component Value Date   CHOL 196 09/27/2014   HDL 86 09/27/2014   LDLCALC 97 09/27/2014   TRIG 65 09/27/2014   CHOLHDL 2.3 09/27/2014   Patient is on Vitamin D supplement.  Has osteoporosis, not on any meds, follows with Dr. Huntley Dec.  She has back pain, on elavil, was trying to taper off but had to get back on it and it helps.  Current Medications:  Current Outpatient Prescriptions on File Prior to Visit  Medication Sig Dispense Refill  . amitriptyline (ELAVIL) 50 MG tablet TAKE 1 TABLET BY MOUTH AT BEDTIME 90 tablet 0   No current facility-administered medications on file prior to visit.     Allergies:  Allergies  Allergen Reactions  . Asa [Aspirin] Shortness Of Breath  . Omnicef [Cefdinir]   . Penicillins   . Sulfa Antibiotics Rash   Medical History:  Past Medical History:  Diagnosis Date  . Asthma   . Osteopenia    2013 DEXA  . Vitamin D deficiency    Physical Exam: Estimated body mass index is 20.73 kg/m as calculated from the following:   Height as of this encounter:  (1.6 m).   Weight as of this encounter: 117 lb (53.1 kg). BP 116/78   Pulse (!) 102   Temp (!) 97.5 F (36.4 C)   Resp 14   Ht  (1.6 m)   Wt 117 lb (53.1 kg)   LMP 12/03/2007   SpO2 95%   BMI 20.73 kg/m  General Appearance: Well nourished, in no apparent distress.  Eyes: PERRLA, EOMs, conjunctiva no swelling or erythema, normal fundi and vessels.  Sinuses: No Frontal/maxillary tenderness  ENT/Mouth: Ext aud canals clear, normal light reflex with TMs without erythema, bulging. Good dentition. No erythema, swelling, or exudate on post pharynx. Tonsils not swollen or erythematous. Hearing normal.  Neck: Supple, thyroid normal. No bruits  Respiratory: Respiratory effort normal, BS equal bilaterally without rales, rhonchi, wheezing or stridor.  Cardio: RRR without murmurs, rubs or gallops. Brisk peripheral pulses without edema.  Chest: symmetric, with normal excursions and percussion.  Abdomen: Soft, nontender, no guarding, rebound, hernias, masses, or organomegaly. .  Lymphatics: Non tender without lymphadenopathy.  Musculoskeletal: Full ROM all peripheral extremities,5/5 strength, and normal gait.  Skin: Warm, dry without rashes, lesions, ecchymosis. Neuro: Cranial nerves intact, reflexes equal bilaterally. Normal muscle tone, no cerebellar symptoms. Sensation intact.  Psych: Awake and oriented X 3, normal affect, Insight and Judgment appropriate.    Quentin Mulling 9:20 AM [facility]  [facility]

## 2017-08-29 ENCOUNTER — Ambulatory Visit (INDEPENDENT_AMBULATORY_CARE_PROVIDER_SITE_OTHER): Payer: No Typology Code available for payment source | Admitting: Physician Assistant

## 2017-08-29 ENCOUNTER — Encounter: Payer: Self-pay | Admitting: Physician Assistant

## 2017-08-29 VITALS — BP 116/78 | HR 102 | Temp 97.5°F | Resp 14 | Ht 63.0 in | Wt 117.0 lb

## 2017-08-29 DIAGNOSIS — E559 Vitamin D deficiency, unspecified: Secondary | ICD-10-CM | POA: Diagnosis not present

## 2017-08-29 DIAGNOSIS — M810 Age-related osteoporosis without current pathological fracture: Secondary | ICD-10-CM

## 2017-08-29 DIAGNOSIS — J45909 Unspecified asthma, uncomplicated: Secondary | ICD-10-CM

## 2017-08-29 DIAGNOSIS — M5440 Lumbago with sciatica, unspecified side: Secondary | ICD-10-CM | POA: Diagnosis not present

## 2017-10-27 ENCOUNTER — Other Ambulatory Visit: Payer: Self-pay | Admitting: Internal Medicine

## 2017-12-17 LAB — HM MAMMOGRAPHY

## 2017-12-17 LAB — HM DEXA SCAN

## 2018-01-22 ENCOUNTER — Other Ambulatory Visit: Payer: Self-pay | Admitting: Internal Medicine

## 2018-02-24 NOTE — Progress Notes (Signed)
6 month follow up  Assessment and Plan:  Uncomplicated asthma, unspecified asthma severity, unspecified whether persistent Controlled  Osteoporosis, unspecified osteoporosis type, unspecified pathological fracture presence Continue follow up Suggest trying prolia, discussed medication  Vitamin D deficiency Getting checking Dr. Huntley Dectomlin   Bilateral low back pain with sciatica, sciatica laterality unspecified, unspecified chronicity Continue elavil, continue exericise   Follow up 1 year for CPE Discussed med's effects and SE's. Screening labs and tests as requested with regular follow-up as recommended. No future appointments.  HPI  57 y.o. female  presents for follow up for 6 month.    Her blood pressure has been controlled at home, today their BP is BP: 120/66 She does workout, zumba and water classes at the gym. She denies chest pain, shortness of breath, dizziness. On Shaklee products.  She is not on cholesterol medication and denies myalgias. Her cholesterol is at goal. The cholesterol last visit was:   Lab Results  Component Value Date   CHOL 196 09/27/2014   HDL 86 09/27/2014   LDLCALC 97 09/27/2014   TRIG 65 09/27/2014   CHOLHDL 2.3 09/27/2014   Patient is on Vitamin D supplement.  Has osteoporosis, not on any meds, follows with Dr. Huntley Decomlin. Had questions about prolia, could not tolerate evista or fosamax. We discussed her FRAX score, the different side effects of prolia. She is seeing a Systems analystpersonal trainer and doing weight training.  She has back pain, on elavil, was trying to taper off but had to get back on it and it helps.  BMI is Body mass index is 20.62 kg/m., she is working on diet and exercise. Wt Readings from Last 3 Encounters:  02/26/18 116 lb 6.4 oz (52.8 kg)  08/29/17 117 lb (53.1 kg)  09/05/16 119 lb (54 kg)    Current Medications:  Current Outpatient Medications on File Prior to Visit  Medication Sig Dispense Refill  . amitriptyline (ELAVIL) 50 MG  tablet TAKE 1 TABLET BY MOUTH AT BEDTIME 90 tablet 0   No current facility-administered medications on file prior to visit.     Allergies:  Allergies  Allergen Reactions  . Asa [Aspirin] Shortness Of Breath  . Omnicef [Cefdinir]   . Penicillins   . Sulfa Antibiotics Rash   Medical History:  Past Medical History:  Diagnosis Date  . Asthma   . Osteopenia    2013 DEXA  . Vitamin D deficiency    Physical Exam: Estimated body mass index is 20.62 kg/m as calculated from the following:   Height as of this encounter: 5\' 3"  (1.6 m).   Weight as of this encounter: 116 lb 6.4 oz (52.8 kg). BP 120/66   Pulse 100   Temp 97.7 F (36.5 C)   Resp 16   Ht 5\' 3"  (1.6 m)   Wt 116 lb 6.4 oz (52.8 kg)   LMP 12/03/2007   SpO2 96%   BMI 20.62 kg/m  General Appearance: Well nourished, in no apparent distress.  Eyes: PERRLA, EOMs, conjunctiva no swelling or erythema, normal fundi and vessels.  Sinuses: No Frontal/maxillary tenderness  ENT/Mouth: Ext aud canals clear, normal light reflex with TMs without erythema, bulging. Good dentition. No erythema, swelling, or exudate on post pharynx. Tonsils not swollen or erythematous. Hearing normal.  Neck: Supple, thyroid normal. No bruits  Respiratory: Respiratory effort normal, BS equal bilaterally without rales, rhonchi, wheezing or stridor.  Cardio: RRR without murmurs, rubs or gallops. Brisk peripheral pulses without edema.  Chest: symmetric, with normal excursions and percussion.  Abdomen: Soft, nontender, no guarding, rebound, hernias, masses, or organomegaly. .  Lymphatics: Non tender without lymphadenopathy.  Musculoskeletal: Full ROM all peripheral extremities,5/5 strength, and normal gait.  Skin: Warm, dry without rashes, lesions, ecchymosis. Neuro: Cranial nerves intact, reflexes equal bilaterally. Normal muscle tone, no cerebellar symptoms. Sensation intact.  Psych: Awake and oriented X 3, normal affect, Insight and Judgment appropriate.     Quentin Mulling 8:58 AM [facility]

## 2018-02-26 ENCOUNTER — Ambulatory Visit: Payer: No Typology Code available for payment source | Admitting: Physician Assistant

## 2018-02-26 ENCOUNTER — Encounter: Payer: Self-pay | Admitting: Physician Assistant

## 2018-02-26 VITALS — BP 120/66 | HR 100 | Temp 97.7°F | Resp 16 | Ht 63.0 in | Wt 116.4 lb

## 2018-02-26 DIAGNOSIS — J45909 Unspecified asthma, uncomplicated: Secondary | ICD-10-CM | POA: Diagnosis not present

## 2018-02-26 DIAGNOSIS — M810 Age-related osteoporosis without current pathological fracture: Secondary | ICD-10-CM | POA: Diagnosis not present

## 2018-02-26 DIAGNOSIS — E559 Vitamin D deficiency, unspecified: Secondary | ICD-10-CM | POA: Diagnosis not present

## 2018-02-26 NOTE — Patient Instructions (Signed)
Osteoporosis Osteoporosis is the thinning and loss of density in the bones. Osteoporosis makes the bones more brittle, fragile, and likely to break (fracture). Over time, osteoporosis can cause the bones to become so weak that they fracture after a simple fall. The bones most likely to fracture are the bones in the hip, wrist, and spine. What are the causes? The exact cause is not known. What increases the risk? Anyone can develop osteoporosis. You may be at greater risk if you have a family history of the condition or have poor nutrition. You may also have a higher risk if you are:  Female.  50 years old or older.  A smoker.  Not physically active.  White or Asian.  Slender.  What are the signs or symptoms? A fracture might be the first sign of the disease, especially if it results from a fall or injury that would not usually cause a bone to break. Other signs and symptoms include:  Low back and neck pain.  Stooped posture.  Height loss.  How is this diagnosed? To make a diagnosis, your health care provider may:  Take a medical history.  Perform a physical exam.  Order tests, such as: ? A bone mineral density test. ? A dual-energy X-ray absorptiometry test.  How is this treated? The goal of osteoporosis treatment is to strengthen your bones to reduce your risk of a fracture. Treatment may involve:  Making lifestyle changes, such as: ? Eating a diet rich in calcium. ? Doing weight-bearing and muscle-strengthening exercises. ? Stopping tobacco use. ? Limiting alcohol intake.  Taking medicine to slow the process of bone loss or to increase bone density.  Monitoring your levels of calcium and vitamin D.  Follow these instructions at home:  Include calcium and vitamin D in your diet. Calcium is important for bone health, and vitamin D helps the body absorb calcium.  Perform weight-bearing and muscle-strengthening exercises as directed by your health care  provider.  Do not use any tobacco products, including cigarettes, chewing tobacco, and electronic cigarettes. If you need help quitting, ask your health care provider.  Limit your alcohol intake.  Take medicines only as directed by your health care provider.  Keep all follow-up visits as directed by your health care provider. This is important.  Take precautions at home to lower your risk of falling, such as: ? Keeping rooms well lit and clutter free. ? Installing safety rails on stairs. ? Using rubber mats in the bathroom and other areas that are often wet or slippery. Get help right away if: You fall or injure yourself. This information is not intended to replace advice given to you by your health care provider. Make sure you discuss any questions you have with your health care provider. Document Released: 08/28/2005 Document Revised: 04/22/2016 Document Reviewed: 04/28/2014 Elsevier Interactive Patient Education  2018 Elsevier Inc.    Preventing Osteoporosis, Adult Osteoporosis is a condition that causes the bones to get weaker. With osteoporosis, the bones become thinner, and the normal spaces in bone tissue become larger. This can make the bones weak and cause them to break more easily. People who have osteoporosis are more likely to break their wrist, spine, or hip. Even a minor accident or injury can be enough to break weak bones. Osteoporosis can occur with aging. Your body constantly replaces old bone tissue with new tissue. As you get older, you may lose bone tissue more quickly, or it may be replaced more slowly. Osteoporosis is more likely   to develop if you have poor nutrition or do not get enough calcium or vitamin D. Other lifestyle factors can also play a role. By making some diet and lifestyle changes, you can help to keep your bones healthy and help to prevent osteoporosis. What nutrition changes can be made? Nutrition plays an important role in maintaining healthy, strong  bones.  Make sure you get enough calcium every day from food or from calcium supplements. ? If you are age 50 or younger, aim to get 1,000 mg of calcium every day. ? If you are older than age 50, aim to get 1,200 mg of calcium every day.  Try to get enough vitamin D every day. ? If you are age 70 or younger, aim to get 600 international units (IU) every day. ? If you are older than age 70, aim to get 800 international units every day.  Follow a healthy diet. Eat plenty of foods that contain calcium and vitamin D. ? Calcium is in milk, cheese, yogurt, and other dairy products. Some fish and vegetables are also good sources of calcium. Many foods such as cereals and breads have had calcium added to them (are fortified). Check nutrition labels to see how much calcium is in a food or drink. ? Foods that contain vitamin D include milk, cereals, salmon, and tuna. Your body also makes vitamin D when you are out in the sun. Bare skin exposure to the sun on your face, arms, legs, or back for no more than 30 minutes a day, 2 times per week is more than enough. Beyond that, it is important to use sunblock to protect your skin from sunburn, which increases your risk for skin cancer.  What lifestyle changes can be made? Making changes in your everyday life can also play an important role in preventing osteoporosis.  Stay active and get exercise every day. Ask your health care provider what types of exercise are best for you.  Do not use any products that contain nicotine or tobacco, such as cigarettes and e-cigarettes. If you need help quitting, ask your health care provider.  Limit alcohol intake to no more than 1 drink a day for nonpregnant women and 2 drinks a day for men. One drink equals 12 oz of beer, 5 oz of wine, or 1 oz of hard liquor.  Why are these changes important? Making these nutrition and lifestyle changes can:  Help you develop and maintain healthy, strong bones.  Prevent loss of  bone mass and the problems that are caused by that loss, such as broken bones and delayed healing.  Make you feel better mentally and physically.  What can happen if changes are not made? Problems that can result from osteoporosis can be very serious. These may include:  A higher risk of broken bones that are painful and do not heal well.  Physical malformations, such as a collapsed spine or a hunched back.  Problems with movement.  Where to find support: If you need help making changes to prevent osteoporosis, talk with your health care provider. You can ask for a referral to a diet and nutrition specialist (dietitian) and a physical therapist. Where to find more information: Learn more about osteoporosis from:  NIH Osteoporosis and Related Bone Diseases National Resource Center: www.niams.nih.gov/health_info/bone/osteoporosis/osteoporosis_ff.asp  U.S. Office on Women's Health: www.womenshealth.gov/publications/our-publications/fact-sheet/osteoporosis.html  National Osteoporosis Foundation: www.nof.org/patients/what-is-osteoporosis/  Summary  Osteoporosis is a condition that causes weak bones that are more likely to break.  Eating a healthy diet and making   sure you get enough calcium and vitamin D can help prevent osteoporosis.  Other ways to reduce your risk of osteoporosis include getting regular exercise and avoiding alcohol and products that contain nicotine or tobacco. This information is not intended to replace advice given to you by your health care provider. Make sure you discuss any questions you have with your health care provider. Document Released: 12/03/2015 Document Revised: 07/29/2016 Document Reviewed: 07/29/2016 Elsevier Interactive Patient Education  2018 Elsevier Inc.  

## 2018-04-23 ENCOUNTER — Other Ambulatory Visit: Payer: Self-pay | Admitting: Physician Assistant

## 2018-10-07 ENCOUNTER — Ambulatory Visit: Payer: No Typology Code available for payment source | Admitting: Physician Assistant

## 2018-10-07 VITALS — BP 118/74 | HR 89 | Temp 97.4°F | Resp 16 | Ht 62.0 in | Wt 118.6 lb

## 2018-10-07 DIAGNOSIS — Z1159 Encounter for screening for other viral diseases: Secondary | ICD-10-CM

## 2018-10-07 DIAGNOSIS — Z136 Encounter for screening for cardiovascular disorders: Secondary | ICD-10-CM

## 2018-10-07 DIAGNOSIS — I10 Essential (primary) hypertension: Secondary | ICD-10-CM

## 2018-10-07 DIAGNOSIS — Z0001 Encounter for general adult medical examination with abnormal findings: Secondary | ICD-10-CM

## 2018-10-07 DIAGNOSIS — M810 Age-related osteoporosis without current pathological fracture: Secondary | ICD-10-CM

## 2018-10-07 DIAGNOSIS — Z0000 Encounter for general adult medical examination without abnormal findings: Secondary | ICD-10-CM | POA: Diagnosis not present

## 2018-10-07 DIAGNOSIS — Z79899 Other long term (current) drug therapy: Secondary | ICD-10-CM

## 2018-10-07 DIAGNOSIS — Z1329 Encounter for screening for other suspected endocrine disorder: Secondary | ICD-10-CM

## 2018-10-07 DIAGNOSIS — E559 Vitamin D deficiency, unspecified: Secondary | ICD-10-CM

## 2018-10-07 DIAGNOSIS — J45909 Unspecified asthma, uncomplicated: Secondary | ICD-10-CM

## 2018-10-07 DIAGNOSIS — M5440 Lumbago with sciatica, unspecified side: Secondary | ICD-10-CM

## 2018-10-07 MED ORDER — AMITRIPTYLINE HCL 50 MG PO TABS: 50.0000 mg | ORAL_TABLET | Freq: Every day | ORAL | 3 refills | Status: DC

## 2018-10-07 NOTE — Progress Notes (Signed)
Complete Physical  Assessment and Plan: Uncomplicated asthma, unspecified asthma severity, unspecified whether persistent Stable  Vitamin D deficiency -     VITAMIN D 25 Hydroxy (Vit-D Deficiency, Fractures)  Osteoporosis, unspecified osteoporosis type, unspecified pathological fracture presence -     VITAMIN D 25 Hydroxy (Vit-D Deficiency, Fractures) - continue fosamax, non tender AB, can take PPI Saturday night - if worse may need to try prolia  Bilateral low back pain with sciatica, sciatica laterality unspecified, unspecified chronicity - refill elavil  Encounter for general adult medical examination with abnormal findings 1 year  Medication management -     CBC with Differential/Platelet -     COMPLETE METABOLIC PANEL WITH GFR  Encounter for hepatitis C screening test for low risk patient -     Hepatitis C antibody  Screening for thyroid disorder -     TSH  Screening for cardiovascular condition -     EKG 12-Lead  Gets labs with Dr. Huntley Dec, will request notes, labs, MGM, etc.  Discussed med's effects and SE's. Screening labs and tests as requested with regular follow-up as recommended.  HPI  57 y.o. female  presents for a complete physical.   Her blood pressure has been controlled at home, today their BP is BP: 118/74 She does workout. She denies chest pain, shortness of breath, dizziness.  She is not on cholesterol medication and denies myalgias. Her cholesterol is at goal. The cholesterol last visit was:   Lab Results  Component Value Date   CHOL 196 09/27/2014   HDL 86 09/27/2014   LDLCALC 97 09/27/2014   TRIG 65 09/27/2014   CHOLHDL 2.3 09/27/2014    Patient is on Vitamin D supplement.   Has osteoporosis, started on fosamax, has had 6 doses, follows with Dr. Huntley Dec.  BMI is Body mass index is 21.69 kg/m., she is working on diet and exercise. Wt Readings from Last 3 Encounters:  10/07/18 118 lb 9.6 oz (53.8 kg)  02/26/18 116 lb 6.4 oz (52.8 kg)   08/29/17 117 lb (53.1 kg)    Current Medications:  Current Outpatient Medications on File Prior to Visit  Medication Sig Dispense Refill  . alendronate (FOSAMAX) 70 MG tablet Take 70 mg by mouth once a week. Take with a full glass of water on an empty stomach.    Marland Kitchen amitriptyline (ELAVIL) 50 MG tablet TAKE 1 TABLET BY MOUTH AT BEDTIME 90 tablet 1   No current facility-administered medications on file prior to visit.    Health Maintenance:   Immunization History  Administered Date(s) Administered  . Tdap 09/27/2014    Tetanus: 2015 Pneumovax: Flu vaccine:declines Zostavax: LMP: P. Hysterectomy 2009 Pap: Dr. Huntley Dec Dec/ 2018 every 3 years MGM: 2018 DEXA: nov 2013 with Dr. Huntley Dec Colonoscopy: 09/2009 AT [facility], Dr. Jessee Avers EGD: Last Dental Exam: Dr. Dahlia Client Last Eye Exam: Dr. Ezequiel Kayser  Patient Care Team: Lucky Cowboy, MD as PCP - General (Internal Medicine) Pryor Ochoa, MD (Inactive) as Consulting Physician (Vascular Surgery) Harold Hedge, MD as Consulting Physician (Obstetrics and Gynecology) Tia Alert, MD as Consulting Physician (Neurosurgery) Vida Rigger, MD as Consulting Physician (Gastroenterology)   Problem list She has Asthma; Vitamin D deficiency; Osteoporosis; and Lower back pain on their problem list.  Allergies Allergies  Allergen Reactions  . Asa [Aspirin] Shortness Of Breath  . Omnicef [Cefdinir]   . Penicillins   . Sulfa Antibiotics Rash    SURGICAL HISTORY She  has a past surgical history that includes Back surgery (2010); Tonsillectomy;  Knee arthroscopy (Right); and Partial hysterectomy (2009). FAMILY HISTORY Her family history includes Alzheimer's disease in her father; Depression in her mother; Heart disease in her mother; Hypertension in her mother and sister; Stroke in her mother. SOCIAL HISTORY She  reports that she has never smoked. She has never used smokeless tobacco. She reports that she does not drink alcohol or use  drugs.  Physical Exam: Estimated body mass index is 21.69 kg/m as calculated from the following:   Height as of this encounter: 5\' 2"  (1.575 m).   Weight as of this encounter: 118 lb 9.6 oz (53.8 kg). BP 118/74   Pulse 89   Temp (!) 97.4 F (36.3 C)   Resp 16   Ht 5\' 2"  (1.575 m)   Wt 118 lb 9.6 oz (53.8 kg)   LMP 12/03/2007   SpO2 99%   BMI 21.69 kg/m  General Appearance: Well nourished, in no apparent distress.  Eyes: PERRLA, EOMs, conjunctiva no swelling or erythema, normal fundi and vessels.  Sinuses: No Frontal/maxillary tenderness  ENT/Mouth: Ext aud canals clear, normal light reflex with TMs without erythema, bulging. Good dentition. No erythema, swelling, or exudate on post pharynx. Tonsils not swollen or erythematous. Hearing normal.  Neck: Supple, thyroid normal. No bruits  Respiratory: Respiratory effort normal, BS equal bilaterally without rales, rhonchi, wheezing or stridor.  Cardio: RRR without murmurs, rubs or gallops. Brisk peripheral pulses without edema.  Chest: symmetric, with normal excursions and percussion.  Breasts: defer Abdomen: Soft, nontender, no guarding, rebound, hernias, masses, or organomegaly. .  Lymphatics: Non tender without lymphadenopathy.  Genitourinary: defer Musculoskeletal: Full ROM all peripheral extremities,5/5 strength, and normal gait. Left 3rd/4th toe with claw toes and abnormal flexion at PIP.  Skin: Warm, dry without rashes, lesions, ecchymosis. Neuro: Cranial nerves intact, reflexes equal bilaterally. Normal muscle tone, no cerebellar symptoms. Sensation intact.  Psych: Awake and oriented X 3, normal affect, Insight and Judgment appropriate.   EKG: WNL no ST changes.   Quentin Mulling 9:38 AM [facility]

## 2018-10-07 NOTE — Patient Instructions (Addendum)
Can try to take 1 omeprazole on Saturday night and take the fosamax on Sunday like you have been doing with a full glass of water Continue the shaklee products If you have any black stool, worsening AB pain, nausea, then need to discuss alternative to fosamax   Google mindful eating and here are some tips and tricks below.   Rate your hunger before you eat on a scale of 1-10, try to eat closer to a 6 or higher. And if you are at below that, why are you eating? Slow down and listen to your body.       Breaking a bone can be serious, especially if the bone is in the hip. People who break a hip sometimes lose the ability to walk on their own. Many of them end up in a nursing home. That's why it is so important to avoid breaking a bone in the first place.   What can I do to keep my bones as healthy as possible? - You can: ?Eat foods with a lot of calcium, such as milk, yogurt, and green leafy vegetables  ?Eat foods with a lot of vitamin D, such as milk that has vitamin D added, and fish from the ocean  ?Take calcium and vitamin D pills (if you do not get enough from the food that you eat) ?Be active for at least 30 minutes, most days of the week ?Avoid smoking  ?Limit the amount of alcohol you drink to 1 to 2 drinks a day at most  What do osteoporosis medicines do? - If you have osteoporosis or a high risk of breaking a bone, the medicines your doctor prescribes can: ?Reduce bone loss  ?Increase bone density or keep it about the same ?Reduce the chances that you will break a bone   Bisphosphonates - Most people being treated for osteoporosis take these medicines first. If they do not work well enough or cause side effects that are too hard to handle, there are other options.   Most people take one pill every week. If your doctor prescribes a bisphosphonate pill, you must take the medicine exactly as directed. If you don't, the medicine can irritate your throat or stomach. For most  bisphosphonate pills, you must:  ?Take the pill first thing in the morning, before you have anything to eat or drink.  ?Drink an 8-ounce glass of water with the pill, but not eat or drink anything else for 30 minutes or 1 hour (depending on which pill you take).  ?Avoid lying down for 30 minutes after taking the pill. (You must sit or stand during that time).      Do water bottle frozen and roll your foot on it Get inserts for your plantar fasciitis Do stretches at night If it is not better in 6-8 weeks we can do an injection   Plantar Fasciitis  Plantar fasciitis is a painful foot condition that affects the heel. It occurs when the band of tissue that connects the toes to the heel bone (plantar fascia) becomes irritated. This can happen after exercising too much or doing other repetitive activities (overuse injury). The pain from plantar fasciitis can range from mild irritation to severe pain that makes it difficult for you to walk or move. The pain is usually worse in the morning or after you have been sitting or lying down for a while. What are the causes? This condition may be caused by:  Standing for long periods of time.  Wearing  shoes that do not fit.  Doing high-impact activities, including running, aerobics, and ballet.  Being overweight.  Having an abnormal way of walking (gait).  Having tight calf muscles.  Having high arches in your feet.  Starting a new athletic activity.  What are the signs or symptoms? The main symptom of this condition is heel pain. Other symptoms include:  Pain that gets worse after activity or exercise.  Pain that is worse in the morning or after resting.  Pain that goes away after you walk for a few minutes.  How is this diagnosed? This condition may be diagnosed based on your signs and symptoms. Your health care provider will also do a physical exam to check for:  A tender area on the bottom of your foot.  A high arch in your  foot.  Pain when you move your foot.  Difficulty moving your foot.  You may also need to have imaging studies to confirm the diagnosis. These can include:  X-rays.  Ultrasound.  MRI.  How is this treated? Treatment for plantar fasciitis depends on the severity of the condition. Your treatment may include:  Rest, ice, and over-the-counter pain medicines to manage your pain.  Exercises to stretch your calves and your plantar fascia.  A splint that holds your foot in a stretched, upward position while you sleep (night splint).  Physical therapy to relieve symptoms and prevent problems in the future.  Cortisone injections to relieve severe pain.  Extracorporeal shock wave therapy (ESWT) to stimulate damaged plantar fascia with electrical impulses. It is often used as a last resort before surgery.  Surgery, if other treatments have not worked after 12 months.  Follow these instructions at home:  Take medicines only as directed by your health care provider.  Avoid activities that cause pain.  Roll the bottom of your foot over a bag of ice or a bottle of cold water. Do this for 20 minutes, 3-4 times a day.  Perform simple stretches as directed by your health care provider.  Try wearing athletic shoes with air-sole or gel-sole cushions or soft shoe inserts.  Wear a night splint while sleeping, if directed by your health care provider.  Keep all follow-up appointments with your health care provider. How is this prevented?  Do not perform exercises or activities that cause heel pain.  Consider finding low-impact activities if you continue to have problems.  Lose weight if you need to. The best way to prevent plantar fasciitis is to avoid the activities that aggravate your plantar fascia. Contact a health care provider if:  Your symptoms do not go away after treatment with home care measures.  Your pain gets worse.  Your pain affects your ability to move or do your  daily activities. This information is not intended to replace advice given to you by your health care provider. Make sure you discuss any questions you have with your health care provider. Document Released: 08/13/2001 Document Revised: 04/22/2016 Document Reviewed: 09/28/2014 Elsevier Interactive Patient Education  [employer].

## 2018-10-08 LAB — COMPLETE METABOLIC PANEL WITH GFR
AG RATIO: 2 (calc) (ref 1.0–2.5)
ALT: 34 U/L — AB (ref 6–29)
AST: 30 U/L (ref 10–35)
Albumin: 4.9 g/dL (ref 3.6–5.1)
Alkaline phosphatase (APISO): 58 U/L (ref 33–130)
BILIRUBIN TOTAL: 0.5 mg/dL (ref 0.2–1.2)
BUN: 24 mg/dL (ref 7–25)
CHLORIDE: 102 mmol/L (ref 98–110)
CO2: 34 mmol/L — ABNORMAL HIGH (ref 20–32)
Calcium: 10.3 mg/dL (ref 8.6–10.4)
Creat: 0.81 mg/dL (ref 0.50–1.05)
GFR, EST AFRICAN AMERICAN: 93 mL/min/{1.73_m2} (ref >=60)
GFR, Est Non African American: 80 mL/min/{1.73_m2} (ref >=60)
Globulin: 2.5 g/dL (calc) (ref 1.9–3.7)
Glucose, Bld: 65 mg/dL (ref 65–99)
POTASSIUM: 4.3 mmol/L (ref 3.5–5.3)
SODIUM: 142 mmol/L (ref 135–146)
Total Protein: 7.4 g/dL (ref 6.1–8.1)

## 2018-10-08 LAB — CBC WITH DIFFERENTIAL/PLATELET
BASOS ABS: 78 {cells}/uL (ref 0–200)
Basophils Relative: 1.7 %
Eosinophils Absolute: 478 cells/uL (ref 15–500)
Eosinophils Relative: 10.4 %
HEMATOCRIT: 43 % (ref 35.0–45.0)
HEMOGLOBIN: 14.7 g/dL (ref 11.7–15.5)
LYMPHS ABS: 1362 {cells}/uL (ref 850–3900)
MCH: 30.6 pg (ref 27.0–33.0)
MCHC: 34.2 g/dL (ref 32.0–36.0)
MCV: 89.6 fL (ref 80.0–100.0)
MPV: 11.8 fL (ref 7.5–12.5)
Monocytes Relative: 9.3 %
NEUTROS ABS: 2254 {cells}/uL (ref 1500–7800)
NEUTROS PCT: 49 %
Platelets: 246 10*3/uL (ref 140–400)
RBC: 4.8 10*6/uL (ref 3.80–5.10)
RDW: 11.4 % (ref 11.0–15.0)
Total Lymphocyte: 29.6 %
WBC: 4.6 10*3/uL (ref 3.8–10.8)
WBCMIX: 428 {cells}/uL (ref 200–950)

## 2018-10-08 LAB — HEPATITIS C ANTIBODY
Hepatitis C Ab: NONREACTIVE
SIGNAL TO CUT-OFF: 0.01 (ref <1.00)

## 2018-10-08 LAB — TSH: TSH: 5.26 mIU/L — ABNORMAL HIGH (ref 0.40–4.50)

## 2018-10-08 LAB — VITAMIN D 25 HYDROXY (VIT D DEFICIENCY, FRACTURES): VIT D 25 HYDROXY: 81 ng/mL (ref 30–100)

## 2018-10-13 ENCOUNTER — Other Ambulatory Visit: Payer: Self-pay | Admitting: Internal Medicine

## 2018-10-13 MED ORDER — LEVOTHYROXINE SODIUM 50 MCG PO TABS: ORAL_TABLET | ORAL | 1 refills | Status: DC

## 2018-11-11 ENCOUNTER — Other Ambulatory Visit: Payer: Self-pay

## 2018-11-11 NOTE — Progress Notes (Signed)
   Assessment and Plan: Screening for thyroid disorder -     Thyroglobulin antibody -     Thyroid peroxidase antibody -     TSH -     Ambulatory referral to Endocrinology Will switch from generic to brand name thyroid- intolerant to generic   HPI 57 y.o.female presents for follow up for thyroid start. She is on 50mcg daily and states that she is not liking it as much. She would like to try the brand name. Taking it 60 mins before food with just water, away from supplements, stomach meds, and not on biotin.  Lab Results  Component Value Date   TSH 5.26 (H) 10/07/2018   BMI is Body mass index is 21.8 kg/m., she is working on diet and exercise. Wt Readings from Last 3 Encounters:  11/12/18 119 lb 3.2 oz (54.1 kg)  10/07/18 118 lb 9.6 oz (53.8 kg)  02/26/18 116 lb 6.4 oz (52.8 kg)     Past Medical History:  Diagnosis Date  . Asthma   . Osteopenia    2013 DEXA  . Vitamin D deficiency      Allergies  Allergen Reactions  . Asa [Aspirin] Shortness Of Breath  . Omnicef [Cefdinir]   . Penicillins   . Sulfa Antibiotics Rash    Current Outpatient Medications on File Prior to Visit  Medication Sig  . alendronate (FOSAMAX) 70 MG tablet Take 70 mg by mouth once a week. Take with a full glass of water on an empty stomach.  Marland Kitchen. amitriptyline (ELAVIL) 50 MG tablet Take 1 tablet (50 mg total) by mouth at bedtime.  Marland Kitchen. levothyroxine (SYNTHROID) 50 MCG tablet Take daily on an empty stomach with only water for 30 minutes  & no antacids for 4 hours   No current facility-administered medications on file prior to visit.     ROS: all negative except above.   Physical Exam: Filed Weights   11/12/18 1449  Weight: 119 lb 3.2 oz (54.1 kg)   BP 118/70   Pulse 77   Temp 98.3 F (36.8 C)   Ht 5\' 2"  (1.575 m)   Wt 119 lb 3.2 oz (54.1 kg)   LMP 12/03/2007   SpO2 97%   BMI 21.80 kg/m  General Appearance: Well nourished, in no apparent distress. Eyes: PERRLA, EOMs, conjunctiva no  swelling or erythema Sinuses: No Frontal/maxillary tenderness ENT/Mouth: Ext aud canals clear, TMs without erythema, bulging. No erythema, swelling, or exudate on post pharynx.  Tonsils not swollen or erythematous. Hearing normal.  Neck: Supple, thyroid normal.  Respiratory: Respiratory effort normal, BS equal bilaterally without rales, rhonchi, wheezing or stridor.  Cardio: RRR with no MRGs. Brisk peripheral pulses without edema.  Abdomen: Soft, + BS.  Non tender, no guarding, rebound, hernias, masses. Lymphatics: Non tender without lymphadenopathy.  Musculoskeletal: Full ROM, 5/5 strength, normal gait.  Skin: Warm, dry without rashes, lesions, ecchymosis.  Neuro: Cranial nerves intact. Normal muscle tone, no cerebellar symptoms. Sensation intact.  Psych: Awake and oriented X 3, normal affect, Insight and Judgment appropriate.     Quentin MullingAmanda Lizann Edelman, PA-C 3:21 PM [facility]

## 2018-11-12 ENCOUNTER — Ambulatory Visit (INDEPENDENT_AMBULATORY_CARE_PROVIDER_SITE_OTHER): Payer: No Typology Code available for payment source | Admitting: Physician Assistant

## 2018-11-12 ENCOUNTER — Other Ambulatory Visit: Payer: Self-pay | Admitting: Physician Assistant

## 2018-11-12 ENCOUNTER — Encounter: Payer: Self-pay | Admitting: Physician Assistant

## 2018-11-12 VITALS — BP 118/70 | HR 77 | Temp 98.3°F | Ht 62.0 in | Wt 119.2 lb

## 2018-11-12 DIAGNOSIS — Z1329 Encounter for screening for other suspected endocrine disorder: Secondary | ICD-10-CM

## 2018-11-12 NOTE — Patient Instructions (Addendum)
Why we do not prescribe Armour Thyroid: 1) Armour is purified porcine (pig) thyroid glands, which is NOT without risk for contaminants.  2) The ratio between T3 and T4 in Armour thyroid is physiologic for pigs, NOT for humans.  3) The short half life of T3 can cause fluctuations in blood levels, which can result in mood swings and heart rhythm abnormalities.  4) the concentration of the active substances (T4 and T3) can be expected to vary between different Armour lots, which can cause variation in the thyroid function tests.   We will switch you to the brand name thyroid.   Can get synthroid direct is supported by [employer] in [place], [place]. It is a way to get BRAND NAME synthroid sent directly to your house. I will send a prescription to a mail order pharmacy, you need to call the pharmacy at [phone] or go online to [email or web address] to get started.   Cost:  Pay 25$ for a one month supply Pay 45$ for a two month supply Pay 65$ for a 3 month supply and save 10$  [facility] [address] [place] [phone] Can get brand name cheap from here   Hypothyroidism Hypothyroidism is a disorder of the thyroid. The thyroid is a large gland that is located in the lower front of the neck. The thyroid releases hormones that control how the body works. With hypothyroidism, the thyroid does not make enough of these hormones. What are the causes? Causes of hypothyroidism may include:  Viral infections.  Pregnancy.  Your own defense system (immune system) attacking your thyroid.  Certain medicines.  Birth defects.  Past radiation treatments to your head or neck.  Past treatment with radioactive iodine.  Past surgical removal of part or all of your thyroid.  Problems with the gland that is located in the center of your brain (pituitary).  What are the signs or symptoms? Signs and symptoms of hypothyroidism may include:  Feeling as though you have  no energy (lethargy).  Inability to tolerate cold.  Weight gain that is not explained by a change in diet or exercise habits.  Dry skin.  Coarse hair.  Menstrual irregularity.  Slowing of thought processes.  Constipation.  Sadness or depression.  How is this diagnosed? Your health care provider may diagnose hypothyroidism with blood tests and ultrasound tests. How is this treated? Hypothyroidism is treated with medicine that replaces the hormones that your body does not make. After you begin treatment, it may take several weeks for symptoms to go away. Follow these instructions at home:  Take medicines only as directed by your health care provider.  If you start taking any new medicines, tell your health care provider.  Keep all follow-up visits as directed by your health care provider. This is important. As your condition improves, your dosage needs may change. You will need to have blood tests regularly so that your health care provider can watch your condition. Contact a health care provider if:  Your symptoms do not get better with treatment.  You are taking thyroid replacement medicine and: ? You sweat excessively. ? You have tremors. ? You feel anxious. ? You lose weight rapidly. ? You cannot tolerate heat. ? You have emotional swings. ? You have diarrhea. ? You feel weak. Get help right away if:  You develop chest pain.  You develop an irregular heartbeat.  You develop a rapid heartbeat. This information is not intended to replace advice given to you by your health  care provider. Make sure you discuss any questions you have with your health care provider. Document Released: 11/18/2005 Document Revised: 04/25/2016 Document Reviewed: 04/05/2014 Elsevier Interactive Patient Education  2018 [employer]   Food Choices for Gastroesophageal Reflux Disease, Adult When you have gastroesophageal reflux disease (GERD), the foods you eat and your eating habits are  very important. Choosing the right foods can help ease your discomfort. What guidelines do I need to follow?  Choose fruits, vegetables, whole grains, and low-fat dairy products.  Choose low-fat meat, fish, and poultry.  Limit fats such as oils, salad dressings, butter, nuts, and avocado.  Keep a food diary. This helps you identify foods that cause symptoms.  Avoid foods that cause symptoms. These may be different for everyone.  Eat small meals often instead of 3 large meals a day.  Eat your meals slowly, in a place where you are relaxed.  Limit fried foods.  Cook foods using methods other than frying.  Avoid drinking alcohol.  Avoid drinking large amounts of liquids with your meals.  Avoid bending over or lying down until 2-3 hours after eating. What foods are not recommended? These are some foods and drinks that may make your symptoms worse: Vegetables Tomatoes. Tomato juice. Tomato and spaghetti sauce. Chili peppers. Onion and garlic. Horseradish. Fruits Oranges, grapefruit, and lemon (fruit and juice). Meats High-fat meats, fish, and poultry. This includes hot dogs, ribs, ham, sausage, salami, and bacon. Dairy Whole milk and chocolate milk. Sour cream. Cream. Butter. Ice cream. Cream cheese. Drinks Coffee and tea. Bubbly (carbonated) drinks or energy drinks. Condiments Hot sauce. Barbecue sauce. Sweets/Desserts Chocolate and cocoa. Donuts. Peppermint and spearmint. Fats and Oils High-fat foods. This includes Jamaica[place] fries and potato chips. Other Vinegar. Strong spices. This includes black pepper, white pepper, red pepper, cayenne, curry powder, cloves, ginger, and chili powder. The items listed above may not be a complete list of foods and drinks to avoid. Contact your dietitian for more information. This information is not intended to replace advice given to you by your health care provider. Make sure you discuss any questions you have with your health care  provider. Document Released: 05/19/2012 Document Revised: 04/25/2016 Document Reviewed: 09/22/2013 Elsevier Interactive Patient Education  2017 [employer]

## 2018-11-13 LAB — THYROID PEROXIDASE ANTIBODY: Thyroperoxidase Ab SerPl-aCnc: 36 IU/mL — ABNORMAL HIGH (ref <9)

## 2018-11-13 LAB — THYROGLOBULIN ANTIBODY: Thyroglobulin Ab: 1 IU/mL (ref <=1)

## 2018-11-13 LAB — TSH: TSH: 1.71 mIU/L (ref 0.40–4.50)

## 2019-02-22 ENCOUNTER — Other Ambulatory Visit: Payer: Self-pay

## 2019-02-22 MED ORDER — LEVOTHYROXINE SODIUM 50 MCG PO TABS: ORAL_TABLET | ORAL | 1 refills | Status: DC

## 2019-02-22 NOTE — Progress Notes (Signed)
Levothyroxine refill request.

## 2019-03-08 ENCOUNTER — Telehealth: Payer: Self-pay | Admitting: Physician Assistant

## 2019-03-08 NOTE — Telephone Encounter (Signed)
Patient called to request records sent to new endocrinology provider, Dr Andria Rhein [facility]. Phone: [phone].  Faxed last two notes and labs to Dr Andria Rhein, [facility].  Marchelle Folks,  Please advise if a new referral needs to be entered. Original referral was sent  to Dr Talmage Nap 11/2018. Recently, Patient self referred to Dr Kathreen Cosier.

## 2019-03-08 NOTE — Telephone Encounter (Signed)
It should be fine, they will request a referral if need be. A lot of people do not need a referral depending on insurance.

## 2019-10-13 NOTE — Progress Notes (Signed)
Complete Physical  Assessment and Plan:  Encounter for general adult medical examination with abnormal findings 1 year  Vitamin D deficiency -     Vitamin D (25 hydroxy)  Osteoporosis, unspecified osteoporosis type, unspecified pathological fracture presence -     Vitamin D (25 hydroxy) - follows with GYN  Uncomplicated asthma, unspecified asthma severity, unspecified whether persistent Monitor May need CXR in future  Bilateral low back pain with sciatica, sciatica laterality unspecified, unspecified chronicity Monitor  Medication management -     CBC with Diff -     COMPLETE METABOLIC PANEL WITH GFR -     Magnesium  Screening for thyroid disorder Has seen endocrine  Memory changes Normal neuro exam ? With stress Check labs, monitor sleep- may need sleep study - if symptoms worsen or any new symptoms may refer to neuro or get imaging -     Sedimentation rate -     RPR -     HIV antibody -     LYME  B. Burgdorfi Antibodies  Encounter for screening for HIV -     HIV antibody  Screening, anemia, deficiency, iron -     Vitamin B12 -     Iron -     Ferritin  Epigastric pain -     Amylase - do trial of pepcid, follow up with GI - Please go to the ER if you have any severe AB pain, unable to hold down food/water, blood in stool or vomit, chest pain, shortness of breath, or any worsening symptoms.  - may need imaging if symptoms persist  Screening for cardiovascular condition -     EKG 12-Lead  Screening for hematuria or proteinuria -     Urinalysis, Routine w reflex microscopic -     Microalbumin / Creatinine Urine Ratio     Gets labs with Dr. Gertie Fey, will request notes, labs, MGM, etc.  Discussed med's effects and SE's. Screening labs and tests as requested with regular follow-up as recommended.  HPI  58 y.o. female  presents for a complete physical.   She had subclinical hypothyroidism, is not on medications, being monitored by Dr. Denton Lank.  Lab  Results  Component Value Date   TSH 1.71 11/12/2018   She states she is hungry all the time.  She has some memory issues, mainly recalling words. She has taken a wrong turn recently. She has done gluten free to try to help with brain fog. Mom and dad with dementia in late 70's/80's.  She does not have a restorative sleep, will have nocturia 2-3 times at night and wakes up tired. She does not snore.  She has history of HA that is unchanged, She denies any associated neurological complications or symptoms, such as one-sided weakness, numbness, tingling, slurring of speech, droopy face, swallowing difficulties, diplopia, vision loss, hearing loss or tinnitus.  She denies rash, joint pain, tick exposure but does have  Dog. She has had a dry cough x 3 months, no mucus, no SOB or CP with working out, no wheezing. She has had a hiatal hernia in the past.  No abnormal stools, no nausea, vomiting, + GERD occ, not on meds.   Lab Results  Component Value Date   IRON 55 09/27/2014   TIBC 339 09/27/2014   FERRITIN 58 09/27/2014   Lab Results  Component Value Date   VITAMINB12 >2000 (H) 09/27/2014    Her blood pressure has been controlled at home, today their BP is BP: 120/74 She does  workout. She denies chest pain, shortness of breath, dizziness.  She is not on cholesterol medication and denies myalgias. Her cholesterol is at goal. The cholesterol last visit was:   Lab Results  Component Value Date   CHOL 196 09/27/2014   HDL 86 09/27/2014   LDLCALC 97 09/27/2014   TRIG 65 09/27/2014   CHOLHDL 2.3 09/27/2014    Patient is on Vitamin D supplement.   Has osteoporosis, started on fosamax, has had 6 doses, follows with Dr. Huntley Dec.  BMI is Body mass index is 21.77 kg/m., she is working on diet and exercise. Wt Readings from Last 3 Encounters:  10/18/19 119 lb (54 kg)  11/12/18 119 lb 3.2 oz (54.1 kg)  10/07/18 118 lb 9.6 oz (53.8 kg)    Current Medications:  Current Outpatient Medications  on File Prior to Visit  Medication Sig Dispense Refill  . alendronate (FOSAMAX) 70 MG tablet Take 70 mg by mouth once a week. Take with a full glass of water on an empty stomach.     No current facility-administered medications on file prior to visit.    Health Maintenance:   Immunization History  Administered Date(s) Administered  . Tdap 09/27/2014    Tetanus: 2015 Pneumovax: Flu vaccine:declines Zostavax: LMP: P. Hysterectomy 2009 Pap: Dr. Huntley Dec Dec/ 2018 every 3 years MGM: 06/2019 at Dr. Huntley Dec DEXA: 12/2017 Dr. Huntley Dec- needs repeat 2021 Colonoscopy: 09/2009 AT [facility], Dr. Jessee Avers EGD: Last Dental Exam: Dr. Dahlia Client Last Eye Exam: Dr. Ezequiel Kayser  Patient Care Team: Lucky Cowboy, MD as PCP - General (Internal Medicine) Pryor Ochoa, MD (Inactive) as Consulting Physician (Vascular Surgery) Harold Hedge, MD as Consulting Physician (Obstetrics and Gynecology) Tia Alert, MD as Consulting Physician (Neurosurgery) Vida Rigger, MD as Consulting Physician (Gastroenterology)   Problem list She has Asthma; Vitamin D deficiency; Osteoporosis; and Lower back pain on their problem list.  Allergies Allergies  Allergen Reactions  . Asa [Aspirin] Shortness Of Breath  . Omnicef [Cefdinir]   . Penicillins   . Sulfa Antibiotics Rash    SURGICAL HISTORY She  has a past surgical history that includes Back surgery (2010); Tonsillectomy; Knee arthroscopy (Right); and Partial hysterectomy (2009). FAMILY HISTORY Her family history includes Alzheimer's disease in her father; Depression in her mother; Heart disease in her mother; Hypertension in her mother and sister; Stroke in her mother. SOCIAL HISTORY She  reports that she has never smoked. She has never used smokeless tobacco. She reports that she does not drink alcohol or use drugs.  Physical Exam: Estimated body mass index is 21.77 kg/m as calculated from the following:   Height as of this encounter: 5\' 2"  (1.575 m).   Weight  as of this encounter: 119 lb (54 kg). BP 120/74   Pulse 66   Temp (!) 97.3 F (36.3 C)   Ht 5\' 2"  (1.575 m)   Wt 119 lb (54 kg)   LMP 12/03/2007   SpO2 95%   BMI 21.77 kg/m  General Appearance: Well nourished, in no apparent distress.  Eyes: PERRLA, EOMs, conjunctiva no swelling or erythema, normal fundi and vessels.  Sinuses: No Frontal/maxillary tenderness  ENT/Mouth: Ext aud canals clear, normal light reflex with TMs without erythema, bulging. Good dentition, narrow palate/mouth. No erythema, swelling, or exudate on post pharynx. Tonsils not swollen or erythematous. Hearing normal.  Neck: Supple, thyroid normal. No bruits  Respiratory: Respiratory effort normal, BS equal bilaterally without rales, rhonchi, wheezing or stridor.  Cardio: RRR without murmurs, rubs or gallops. Brisk  peripheral pulses without edema.  Chest: symmetric, with normal excursions and percussion.  Breasts: defer Abdomen: Soft, + epigastric tenderness, no guarding, rebound, hernias, masses, or organomegaly. .  Lymphatics: Non tender without lymphadenopathy.  Genitourinary: defer Musculoskeletal: Full ROM all peripheral extremities,5/5 strength, and normal gait. Left 3rd/4th toe with claw toes and abnormal flexion at PIP.  Skin: Warm, dry without rashes, lesions, ecchymosis. Neuro: Cranial nerves intact, reflexes equal bilaterally. Normal muscle tone, no cerebellar symptoms. Sensation intact.  Psych: Awake and oriented X 3, normal affect, Insight and Judgment appropriate.   EKG: WNL, PRWP, no ST changes.   Allison Beck 9:08 AM [facility]

## 2019-10-18 ENCOUNTER — Ambulatory Visit: Payer: No Typology Code available for payment source | Admitting: Physician Assistant

## 2019-10-18 ENCOUNTER — Other Ambulatory Visit: Payer: Self-pay

## 2019-10-18 ENCOUNTER — Encounter: Payer: Self-pay | Admitting: Physician Assistant

## 2019-10-18 VITALS — BP 120/74 | HR 66 | Temp 97.3°F | Ht 62.0 in | Wt 119.0 lb

## 2019-10-18 DIAGNOSIS — R413 Other amnesia: Secondary | ICD-10-CM

## 2019-10-18 DIAGNOSIS — Z130 Encounter for screening for diseases of the blood and blood-forming organs and certain disorders involving the immune mechanism: Secondary | ICD-10-CM

## 2019-10-18 DIAGNOSIS — M810 Age-related osteoporosis without current pathological fracture: Secondary | ICD-10-CM

## 2019-10-18 DIAGNOSIS — Z136 Encounter for screening for cardiovascular disorders: Secondary | ICD-10-CM

## 2019-10-18 DIAGNOSIS — M5440 Lumbago with sciatica, unspecified side: Secondary | ICD-10-CM

## 2019-10-18 DIAGNOSIS — Z1389 Encounter for screening for other disorder: Secondary | ICD-10-CM

## 2019-10-18 DIAGNOSIS — Z79899 Other long term (current) drug therapy: Secondary | ICD-10-CM

## 2019-10-18 DIAGNOSIS — Z0001 Encounter for general adult medical examination with abnormal findings: Secondary | ICD-10-CM

## 2019-10-18 DIAGNOSIS — Z0000 Encounter for general adult medical examination without abnormal findings: Secondary | ICD-10-CM

## 2019-10-18 DIAGNOSIS — J45909 Unspecified asthma, uncomplicated: Secondary | ICD-10-CM

## 2019-10-18 DIAGNOSIS — R1013 Epigastric pain: Secondary | ICD-10-CM

## 2019-10-18 DIAGNOSIS — I10 Essential (primary) hypertension: Secondary | ICD-10-CM | POA: Diagnosis not present

## 2019-10-18 DIAGNOSIS — E559 Vitamin D deficiency, unspecified: Secondary | ICD-10-CM

## 2019-10-18 DIAGNOSIS — Z1329 Encounter for screening for other suspected endocrine disorder: Secondary | ICD-10-CM

## 2019-10-18 DIAGNOSIS — Z114 Encounter for screening for human immunodeficiency virus [HIV]: Secondary | ICD-10-CM

## 2019-10-18 MED ORDER — AMITRIPTYLINE HCL 50 MG PO TABS: 50.0000 mg | ORAL_TABLET | Freq: Every day | ORAL | 3 refills | Status: DC

## 2019-10-18 NOTE — Patient Instructions (Addendum)
Dr. Watt Climes Phone: [phone] Please call for colonoscopy  11 Tips to Follow:  1. No caffeine after 3pm: Avoid beverages with caffeine (soda, tea, energy drinks, etc.) especially after 3pm. 2. Don't go to bed hungry: Have your evening meal at least 3 hrs. before going to sleep. It's fine to have a small bedtime snack such as a glass of milk and a few crackers but don't have a big meal. 3. Have a nightly routine before bed: Plan on "winding down" before you go to sleep. Begin relaxing about 1 hour before you go to bed. Try doing a quiet activity such as listening to calming music, reading a book or meditating. 4. Turn off the TV and ALL electronics including video games, tablets, laptops, etc. 1 hour before sleep, and keep them out of the bedroom. 5. Turn off your cell phone and all notifications (new email and text alerts) or even better, leave your phone outside your room while you sleep. Studies have shown that a part of your brain continues to respond to certain lights and sounds even while you're still asleep. 6. Make your bedroom quiet, dark and cool. If you can't control the noise, try wearing earplugs or using a fan to block out other sounds. 7. Practice relaxation techniques. Try reading a book or meditating or drain your brain by writing a list of what you need to do the next day. 8. Don't nap unless you feel sick: you'll have a better night's sleep. 9. Don't smoke, or quit if you do. Nicotine, alcohol, and marijuana can all keep you awake. Talk to your health care provider if you need help with substance use. 10. Most importantly, wake up at the same time every day (or within 1 hour of your usual wake up time) EVEN on the weekends. A regular wake up time promotes sleep hygiene and prevents sleep problems. 11. Reduce exposure to bright light in the last three hours of the day before going to sleep. Maintaining good sleep hygiene and having good sleep habits lower your risk of developing sleep  problems. Getting better sleep can also improve your concentration and alertness. Try the simple steps in this guide. If you still have trouble getting enough rest, make an appointment with your health care provider.    Generally a cough is either coming from above or from below- so we will treat this OR it can be from irritation/viral cough  To treat the nasal drip: Get on the chlorphenirmine every 6 hours- This medication can make you sleepy but helps with nasal drip- get from over the counter.   Can do a steroid nasal spary 1-2 sparys at night each nostril. Remember to spray each nostril twice towards the outer part of your eye.  Do not sniff but instead pinch your nose and tilt your head back to help the medicine get into your sinuses.  The best time to do this is at bedtime. Stop if you get blurred vision or nose bleeds.   To treat the reflux Will send in prilosec 40 mg to take once in the morning and take prevacid from over the counter at night for 2 weeks- then stop the prilosec and continue the pravacid or famotadine  To stop irritation: Need to STOP the cough Do sugar free candy Do the tessalon drops VOICE REST is VERY important  If not better in 2 weeks will refer to ENT  Go to the ER or call the office if you get any chest pain,  shortness of breath, severe  headache, leg swelling.    Common causes of cough OR hoarseness OR sore throat:   Allergies, Viral Infections, Acid Reflux and Bacterial Infections.    Allergies and viral infections cause a cough OR sore throat by post nasal drip and are often worse at night, can also have sneezing, lower grade fevers, clear/yellow mucus. This is best treated with allergy medications or nasal sprays.  Please get on allegra for 1-2 weeks The strongest is allegra or fexafinadine  Cheapest at [employer], [employer], [employer]   Bacterial infections are more severe than allergies or viral infections with fever, teeth pain, fatigue. This can be  treated with prednisone and the same over the counter medication and after 7 days can be treated with an antibiotic.   Silent reflux/GERD can cause a cough OR sore throat OR hoarseness WITHOUT heart burn because the esophagus that goes to the stomach and trachea that goes to the lungs are very close and when you lay down the acid can irritate your throat and lungs. This can cause hoarseness, cough, and wheezing. Please stop any alcohol or anti-inflammatories like aleve/advil/ibuprofen and start an over the counter Prilosec or omeprazole 1-2 times daily before food for 2 weeks, then switch to over the counter zantac/ratinidine or pepcid/famotadine once at night for 2 weeks.    sometimes irritation causes more irritation. Try voice rest, use sugar free cough drops to prevent coughing, and try to stop clearing your throat.   If you ever have a cough that does not go away after trying these things please make a follow up visit for further evaluation or we can refer you to a specialist. Or if you ever have shortness of breath or chest pain go to the ER.    Silent reflux: Not all heartburn burns...Marland KitchenMarland KitchenMarland Kitchen  What is LPR? Laryngopharyngeal reflux (LPR) or silent reflux is a condition in which acid that is made in the stomach travels up the esophagus (swallowing tube) and gets to the throat. Not everyone with reflux has a lot of heartburn or indigestion. In fact, many people with LPR never have heartburn. This is why LPR is called SILENT REFLUX, and the terms "Silent reflux" and "LPR" are often used interchangeably. Because LPR is silent, it is sometimes difficult to diagnose.  How can you tell if you have LPR?  Marland Kitchen Chronic hoarseness- Some people have hoarseness that comes and goes . throat clearing  . Cough . It can cause shortness of breath and cause asthma like symptoms. Marland Kitchen a feeling of a lump in the throat  . difficulty swallowing . a problem with too much nose and throat drainage.  . Some people  will feel their esophagus spasm which feels like their heart beating hard and fast, this will usually be after a meal, at rest, or lying down at night.    How do I treat this? Treatment for LPR should be individualized, and your doctor will suggest the best treatment for you. Generally there are several treatments for LPR: . changing habits and diet to reduce reflux,  . medications to reduce stomach acid, and  . surgery to prevent reflux. Most people with LPR need to modify how and when they eat, as well as take some medication, to get well. Sometimes, nonprescription liquid antacids, such as Maalox, Gelucil and Mylanta are recommended. When used, these antacids should be taken four times each day - one tablespoon one hour after each meal and before bedtime. Dietary and lifestyle  changes alone are not often enough to control LPR - medications that reduce stomach acid are also usually needed. These must be prescribed by our doctor.   TIPS FOR REDUCING REFLUX AND LPR Control your LIFE-STYLE and your DIET! Marland Kitchen. If you use tobacco, QUIT.  Marland Kitchen. Smoking makes you reflux. After every cigarette you have some LPR.  . Don't wear clothing that is too tight, especially around the waist (trousers, corsets, belts).  . Do not lie down just after eating...in fact, do not eat within three hours of bedtime.  . You should be on a low-fat diet.  . Limit your intake of red meat.  . Limit your intake of butter.  Marland Kitchen. Avoid fried foods.  . Avoid chocolate  . Avoid cheese.  Marland Kitchen. Avoid eggs. Marland Kitchen. Specifically avoid caffeine (especially coffee and tea), soda pop (especially cola) and mints.  . Avoid alcoholic beverages, particularly in the evening.

## 2019-10-19 LAB — CBC WITH DIFFERENTIAL/PLATELET
Absolute Monocytes: 433 cells/uL (ref 200–950)
Basophils Absolute: 80 cells/uL (ref 0–200)
Basophils Relative: 1.9 %
Eosinophils Absolute: 424 cells/uL (ref 15–500)
Eosinophils Relative: 10.1 %
HCT: 45 % (ref 35.0–45.0)
Hemoglobin: 15 g/dL (ref 11.7–15.5)
Lymphs Abs: 1100 cells/uL (ref 850–3900)
MCH: 29.9 pg (ref 27.0–33.0)
MCHC: 33.3 g/dL (ref 32.0–36.0)
MCV: 89.8 fL (ref 80.0–100.0)
MPV: 11.8 fL (ref 7.5–12.5)
Monocytes Relative: 10.3 %
Neutro Abs: 2163 cells/uL (ref 1500–7800)
Neutrophils Relative %: 51.5 %
Platelets: 240 10*3/uL (ref 140–400)
RBC: 5.01 10*6/uL (ref 3.80–5.10)
RDW: 11.7 % (ref 11.0–15.0)
Total Lymphocyte: 26.2 %
WBC: 4.2 10*3/uL (ref 3.8–10.8)

## 2019-10-19 LAB — VITAMIN B12: Vitamin B-12: >2000 pg/mL — ABNORMAL HIGH (ref 200–1100)

## 2019-10-19 LAB — VITAMIN D 25 HYDROXY (VIT D DEFICIENCY, FRACTURES): Vit D, 25-Hydroxy: 74 ng/mL (ref 30–100)

## 2019-10-19 LAB — COMPLETE METABOLIC PANEL WITH GFR
AG Ratio: 1.8 (calc) (ref 1.0–2.5)
ALT: 26 U/L (ref 6–29)
AST: 26 U/L (ref 10–35)
Albumin: 4.8 g/dL (ref 3.6–5.1)
Alkaline phosphatase (APISO): 55 U/L (ref 37–153)
BUN: 24 mg/dL (ref 7–25)
CO2: 32 mmol/L (ref 20–32)
Calcium: 10.7 mg/dL — ABNORMAL HIGH (ref 8.6–10.4)
Chloride: 102 mmol/L (ref 98–110)
Creat: 0.7 mg/dL (ref 0.50–1.05)
GFR, Est African American: 110 mL/min/{1.73_m2} (ref >=60)
GFR, Est Non African American: 95 mL/min/{1.73_m2} (ref >=60)
Globulin: 2.7 g/dL (calc) (ref 1.9–3.7)
Glucose, Bld: 73 mg/dL (ref 65–99)
Potassium: 4.5 mmol/L (ref 3.5–5.3)
Sodium: 143 mmol/L (ref 135–146)
Total Bilirubin: 0.5 mg/dL (ref 0.2–1.2)
Total Protein: 7.5 g/dL (ref 6.1–8.1)

## 2019-10-19 LAB — MICROALBUMIN / CREATININE URINE RATIO
Creatinine, Urine: 32 mg/dL (ref 20–275)
Microalb Creat Ratio: 6 mcg/mg creat (ref <30)
Microalb, Ur: 0.2 mg/dL

## 2019-10-19 LAB — URINALYSIS, ROUTINE W REFLEX MICROSCOPIC
Bilirubin Urine: NEGATIVE
Glucose, UA: NEGATIVE
Hgb urine dipstick: NEGATIVE
Ketones, ur: NEGATIVE
Leukocytes,Ua: NEGATIVE
Nitrite: NEGATIVE
Protein, ur: NEGATIVE
Specific Gravity, Urine: 1.013 (ref 1.001–1.03)
pH: >=8.5 — AB (ref 5.0–8.0)

## 2019-10-19 LAB — RPR: RPR Ser Ql: REACTIVE — AB

## 2019-10-19 LAB — FERRITIN: Ferritin: 106 ng/mL (ref 16–232)

## 2019-10-19 LAB — IRON: Iron: 82 ug/dL (ref 45–160)

## 2019-10-19 LAB — FLUORESCENT TREPONEMAL AB(FTA)-IGG-BLD: Fluorescent Treponemal ABS: NONREACTIVE

## 2019-10-19 LAB — HIV ANTIBODY (ROUTINE TESTING W REFLEX): HIV 1&2 Ab, 4th Generation: NONREACTIVE

## 2019-10-19 LAB — MAGNESIUM: Magnesium: 2.4 mg/dL (ref 1.5–2.5)

## 2019-10-19 LAB — SEDIMENTATION RATE: Sed Rate: 34 mm/h — ABNORMAL HIGH (ref 0–30)

## 2019-10-19 LAB — B. BURGDORFI ANTIBODIES: B burgdorferi Ab IgG+IgM: <0.9 index

## 2019-10-19 LAB — AMYLASE: Amylase: 98 U/L (ref 21–101)

## 2019-10-19 LAB — RPR TITER: RPR Titer: 1:1 {titer} — ABNORMAL HIGH

## 2020-04-17 ENCOUNTER — Ambulatory Visit: Payer: No Typology Code available for payment source | Admitting: Physician Assistant

## 2020-05-15 ENCOUNTER — Other Ambulatory Visit: Payer: Self-pay

## 2020-05-15 ENCOUNTER — Encounter (HOSPITAL_COMMUNITY): Payer: Self-pay | Admitting: Orthopedic Surgery

## 2020-05-15 ENCOUNTER — Other Ambulatory Visit (HOSPITAL_COMMUNITY)
Admission: RE | Admit: 2020-05-15 | Discharge: 2020-05-15 | Disposition: A | Discharge status: Home / Self Care | Payer: No Typology Code available for payment source | Source: Ambulatory Visit | Attending: Orthopedic Surgery | Admitting: Orthopedic Surgery

## 2020-05-15 DIAGNOSIS — Z20822 Contact with and (suspected) exposure to covid-19: Secondary | ICD-10-CM | POA: Diagnosis not present

## 2020-05-15 DIAGNOSIS — Z01812 Encounter for preprocedural laboratory examination: Secondary | ICD-10-CM | POA: Insufficient documentation

## 2020-05-15 LAB — SARS CORONAVIRUS 2 (TAT 6-24 HRS): SARS Coronavirus 2: NEGATIVE

## 2020-05-15 NOTE — Progress Notes (Signed)
Patient denies shortness of breath, fever, or chest pain.  PCP - Dr. Alleen Borne Cardiologist - n/a  Chest x-ray - n/a EKG - 10/18/19 Stress Test - n/a ECHO - n/a Cardiac Cath - n/a  ERAS: Per Dr Amanda Pea, Clears til 2:15 pm DOS, no drink.  STOP now taking any Aspirin (unless otherwise instructed by your surgeon), Aleve, Naproxen, Ibuprofen, Motrin, Advil, Goody's, BC's, all herbal medications, fish oil, and all vitamins.   Coronavirus Screening Covid test scheduled 05/15/20. Do you have any of the following symptoms:  Cough Yes Fever (>100.68F)  yes/no: No Runny nose yes/no: No Sore throat yes/no: No Difficulty breathing/shortness of breath  yes/no: No  Have you traveled in the last 14 days and where? yes/no: No  Patient verbalized understanding of instructions that were given via phone.

## 2020-05-16 ENCOUNTER — Ambulatory Visit (HOSPITAL_COMMUNITY): Payer: No Typology Code available for payment source | Admitting: Anesthesiology

## 2020-05-16 ENCOUNTER — Encounter (HOSPITAL_COMMUNITY)
Admission: RE | Disposition: A | Discharge status: Home / Self Care | Payer: Self-pay | Source: Home / Self Care | Attending: Orthopedic Surgery

## 2020-05-16 ENCOUNTER — Ambulatory Visit (HOSPITAL_COMMUNITY)
Admission: RE | Admit: 2020-05-16 | Discharge: 2020-05-16 | Disposition: A | Discharge status: Home / Self Care | Payer: No Typology Code available for payment source | Attending: Orthopedic Surgery | Admitting: Orthopedic Surgery

## 2020-05-16 ENCOUNTER — Encounter (HOSPITAL_COMMUNITY): Payer: Self-pay | Admitting: Orthopedic Surgery

## 2020-05-16 ENCOUNTER — Other Ambulatory Visit: Payer: Self-pay

## 2020-05-16 DIAGNOSIS — K219 Gastro-esophageal reflux disease without esophagitis: Secondary | ICD-10-CM | POA: Insufficient documentation

## 2020-05-16 DIAGNOSIS — M1990 Unspecified osteoarthritis, unspecified site: Secondary | ICD-10-CM | POA: Diagnosis not present

## 2020-05-16 DIAGNOSIS — Z79899 Other long term (current) drug therapy: Secondary | ICD-10-CM | POA: Diagnosis not present

## 2020-05-16 DIAGNOSIS — E559 Vitamin D deficiency, unspecified: Secondary | ICD-10-CM | POA: Insufficient documentation

## 2020-05-16 DIAGNOSIS — J45909 Unspecified asthma, uncomplicated: Secondary | ICD-10-CM | POA: Diagnosis not present

## 2020-05-16 DIAGNOSIS — Z882 Allergy status to sulfonamides status: Secondary | ICD-10-CM | POA: Insufficient documentation

## 2020-05-16 DIAGNOSIS — M8580 Other specified disorders of bone density and structure, unspecified site: Secondary | ICD-10-CM | POA: Diagnosis not present

## 2020-05-16 DIAGNOSIS — Z881 Allergy status to other antibiotic agents status: Secondary | ICD-10-CM | POA: Diagnosis not present

## 2020-05-16 DIAGNOSIS — Z880 Allergy status to penicillin: Secondary | ICD-10-CM | POA: Insufficient documentation

## 2020-05-16 DIAGNOSIS — S52572A Other intraarticular fracture of lower end of left radius, initial encounter for closed fracture: Secondary | ICD-10-CM | POA: Diagnosis present

## 2020-05-16 DIAGNOSIS — X58XXXA Exposure to other specified factors, initial encounter: Secondary | ICD-10-CM | POA: Insufficient documentation

## 2020-05-16 DIAGNOSIS — Z886 Allergy status to analgesic agent status: Secondary | ICD-10-CM | POA: Insufficient documentation

## 2020-05-16 HISTORY — DX: Anemia, unspecified: D64.9

## 2020-05-16 HISTORY — PX: OPEN REDUCTION INTERNAL FIXATION (ORIF) DISTAL RADIAL FRACTURE: SHX5989

## 2020-05-16 HISTORY — DX: Gastro-esophageal reflux disease without esophagitis: K21.9

## 2020-05-16 HISTORY — DX: Unspecified osteoarthritis, unspecified site: M19.90

## 2020-05-16 LAB — CBC
HCT: 45.4 % (ref 36.0–46.0)
Hemoglobin: 14.8 g/dL (ref 12.0–15.0)
MCH: 30.6 pg (ref 26.0–34.0)
MCHC: 32.6 g/dL (ref 30.0–36.0)
MCV: 94 fL (ref 80.0–100.0)
Platelets: 224 10*3/uL (ref 150–400)
RBC: 4.83 MIL/uL (ref 3.87–5.11)
RDW: 12.7 % (ref 11.5–15.5)
WBC: 6.1 10*3/uL (ref 4.0–10.5)
nRBC: 0 % (ref 0.0–0.2)

## 2020-05-16 SURGERY — OPEN REDUCTION INTERNAL FIXATION (ORIF) DISTAL RADIUS FRACTURE
Anesthesia: Monitor Anesthesia Care | Laterality: Left

## 2020-05-16 MED ORDER — MIDAZOLAM HCL 2 MG/2ML IJ SOLN: 1.0000 mg | Freq: Once | INTRAMUSCULAR | Status: AC

## 2020-05-16 MED ORDER — ACETAMINOPHEN 500 MG PO TABS
1000.0000 mg | ORAL_TABLET | Freq: Once | ORAL | Status: AC
  Administered 2020-05-16: 1000 mg via ORAL
  Filled 2020-05-16: qty 2

## 2020-05-16 MED ORDER — VANCOMYCIN HCL IN DEXTROSE 1-5 GM/200ML-% IV SOLN
1000.0000 mg | INTRAVENOUS | Status: AC
  Administered 2020-05-16: 1000 mg via INTRAVENOUS
  Filled 2020-05-16: qty 200

## 2020-05-16 MED ORDER — ORAL CARE MOUTH RINSE: 15.0000 mL | Freq: Once | OROMUCOSAL | Status: AC

## 2020-05-16 MED ORDER — LACTATED RINGERS IV SOLN: INTRAVENOUS | Status: DC

## 2020-05-16 MED ORDER — ONDANSETRON HCL 4 MG/2ML IJ SOLN
INTRAMUSCULAR | Status: DC | PRN
  Administered 2020-05-16: 4 mg via INTRAVENOUS

## 2020-05-16 MED ORDER — MIDAZOLAM HCL 2 MG/2ML IJ SOLN
INTRAMUSCULAR | Status: AC
  Administered 2020-05-16: 1 mg via INTRAVENOUS
  Filled 2020-05-16: qty 2

## 2020-05-16 MED ORDER — PROPOFOL 500 MG/50ML IV EMUL
INTRAVENOUS | Status: DC | PRN
  Administered 2020-05-16: 75 ug/kg/min via INTRAVENOUS

## 2020-05-16 MED ORDER — PROPOFOL 10 MG/ML IV BOLUS
INTRAVENOUS | Status: DC | PRN
  Administered 2020-05-16: 20 mg via INTRAVENOUS

## 2020-05-16 MED ORDER — FENTANYL CITRATE (PF) 100 MCG/2ML IJ SOLN
INTRAMUSCULAR | Status: AC
  Administered 2020-05-16: 50 ug via INTRAVENOUS
  Filled 2020-05-16: qty 2

## 2020-05-16 MED ORDER — BUPIVACAINE-EPINEPHRINE (PF) 0.5% -1:200000 IJ SOLN
INTRAMUSCULAR | Status: DC | PRN
  Administered 2020-05-16: 25 mL via PERINEURAL

## 2020-05-16 MED ORDER — SODIUM CHLORIDE 0.9 % IR SOLN
Status: DC | PRN
  Administered 2020-05-16: 1000 mL

## 2020-05-16 MED ORDER — HYDROMORPHONE HCL 1 MG/ML IJ SOLN: 0.2500 mg | INTRAMUSCULAR | Status: DC | PRN

## 2020-05-16 MED ORDER — FENTANYL CITRATE (PF) 100 MCG/2ML IJ SOLN: 50.0000 ug | Freq: Once | INTRAMUSCULAR | Status: AC

## 2020-05-16 MED ORDER — CHLORHEXIDINE GLUCONATE 0.12 % MT SOLN: 15.0000 mL | Freq: Once | OROMUCOSAL | Status: AC

## 2020-05-16 MED ORDER — CHLORHEXIDINE GLUCONATE 0.12 % MT SOLN
OROMUCOSAL | Status: AC
  Administered 2020-05-16: 15 mL via OROMUCOSAL
  Filled 2020-05-16: qty 15

## 2020-05-16 SURGICAL SUPPLY — 60 items
BIT DRILL 2.2 SS TIBIAL (BIT) ×3 IMPLANT
BLADE CLIPPER SURG (BLADE) IMPLANT
BNDG ELASTIC 3X5.8 VLCR STR LF (GAUZE/BANDAGES/DRESSINGS) ×3 IMPLANT
BNDG ELASTIC 4X5.8 VLCR STR LF (GAUZE/BANDAGES/DRESSINGS) ×3 IMPLANT
BNDG ESMARK 4X9 LF (GAUZE/BANDAGES/DRESSINGS) ×3 IMPLANT
BNDG GAUZE ELAST 4 BULKY (GAUZE/BANDAGES/DRESSINGS) ×3 IMPLANT
CORD BIPOLAR FORCEPS 12FT (ELECTRODE) ×3 IMPLANT
COVER SURGICAL LIGHT HANDLE (MISCELLANEOUS) ×3 IMPLANT
COVER WAND RF STERILE (DRAPES) ×3 IMPLANT
CUFF TOURN SGL QUICK 18X4 (TOURNIQUET CUFF) ×3 IMPLANT
CUFF TOURN SGL QUICK 24 (TOURNIQUET CUFF)
CUFF TRNQT CYL 24X4X16.5-23 (TOURNIQUET CUFF) IMPLANT
DECANTER SPIKE VIAL GLASS SM (MISCELLANEOUS) IMPLANT
DRAIN TLS ROUND 10FR (DRAIN) IMPLANT
DRAPE OEC MINIVIEW 54X84 (DRAPES) ×3 IMPLANT
DRAPE U-SHAPE 47X51 STRL (DRAPES) ×3 IMPLANT
DRSG ADAPTIC 3X8 NADH LF (GAUZE/BANDAGES/DRESSINGS) ×3 IMPLANT
GAUZE SPONGE 4X4 12PLY STRL (GAUZE/BANDAGES/DRESSINGS) ×3 IMPLANT
GAUZE XEROFORM 5X9 LF (GAUZE/BANDAGES/DRESSINGS) ×3 IMPLANT
GLOVE BIOGEL M 8.0 STRL (GLOVE) ×3 IMPLANT
GLOVE SS BIOGEL STRL SZ 8 (GLOVE) ×1 IMPLANT
GLOVE SUPERSENSE BIOGEL SZ 8 (GLOVE) ×2
GOWN STRL REUS W/ TWL LRG LVL3 (GOWN DISPOSABLE) ×3 IMPLANT
GOWN STRL REUS W/ TWL XL LVL3 (GOWN DISPOSABLE) ×3 IMPLANT
GOWN STRL REUS W/TWL LRG LVL3 (GOWN DISPOSABLE) ×9
GOWN STRL REUS W/TWL XL LVL3 (GOWN DISPOSABLE) ×9
KIT BASIN OR (CUSTOM PROCEDURE TRAY) ×3 IMPLANT
KIT TURNOVER KIT B (KITS) ×3 IMPLANT
MANIFOLD NEPTUNE II (INSTRUMENTS) ×3 IMPLANT
NEEDLE 22X1 1/2 (OR ONLY) (NEEDLE) IMPLANT
NS IRRIG 1000ML POUR BTL (IV SOLUTION) ×3 IMPLANT
PACK ORTHO EXTREMITY (CUSTOM PROCEDURE TRAY) ×3 IMPLANT
PAD ARMBOARD 7.5X6 YLW CONV (MISCELLANEOUS) ×6 IMPLANT
PAD CAST 3X4 CTTN HI CHSV (CAST SUPPLIES) ×1 IMPLANT
PAD CAST 4YDX4 CTTN HI CHSV (CAST SUPPLIES) ×3 IMPLANT
PADDING CAST COTTON 3X4 STRL (CAST SUPPLIES) ×3
PADDING CAST COTTON 4X4 STRL (CAST SUPPLIES) ×9
PEG LOCKING SMOOTH 2.2X18 (Peg) ×9 IMPLANT
PEG LOCKING SMOOTH 2.2X20 (Screw) ×6 IMPLANT
PLATE BN MN NAR 41X22 CRSLCK (Plate) ×1 IMPLANT
PLATE CROSSLOCK NAR MINI LT (Plate) ×3 IMPLANT
SCREW LOCK 14X2.7X 3 LD TPR (Screw) ×4 IMPLANT
SCREW LOCK 18X2.7X 3 LD TPR (Screw) ×1 IMPLANT
SCREW LOCKING 2.7X14 (Screw) ×12 IMPLANT
SCREW LOCKING 2.7X18 (Screw) ×3 IMPLANT
SOL PREP POV-IOD 4OZ 10% (MISCELLANEOUS) ×6 IMPLANT
SPONGE LAP 4X18 RFD (DISPOSABLE) IMPLANT
SUT MNCRL AB 4-0 PS2 18 (SUTURE) ×3 IMPLANT
SUT PROLENE 3 0 PS 2 (SUTURE) IMPLANT
SUT PROLENE 4 0 PS 2 18 (SUTURE) ×3 IMPLANT
SUT VIC AB 3-0 FS2 27 (SUTURE) IMPLANT
SYR CONTROL 10ML LL (SYRINGE) IMPLANT
SYSTEM CHEST DRAIN TLS 7FR (DRAIN) IMPLANT
TOWEL GREEN STERILE (TOWEL DISPOSABLE) ×3 IMPLANT
TOWEL GREEN STERILE FF (TOWEL DISPOSABLE) ×3 IMPLANT
TUBE CONNECTING 12'X1/4 (SUCTIONS) ×1
TUBE CONNECTING 12X1/4 (SUCTIONS) ×2 IMPLANT
TUBE EVACUATION TLS (MISCELLANEOUS) ×3 IMPLANT
UNDERPAD 30X36 HEAVY ABSORB (UNDERPADS AND DIAPERS) ×3 IMPLANT
WATER STERILE IRR 1000ML POUR (IV SOLUTION) ×3 IMPLANT

## 2020-05-16 NOTE — Progress Notes (Signed)
Orthopedic Tech Progress Note Patient Details:  Allison Beck 05/08/1961 [id number] PACU RN called requesting an ARM SLING for patient to take home Ortho Devices Type of Ortho Device: Arm sling Ortho Device/Splint Interventions: Other (comment)   Post Interventions Patient Tolerated: Other (comment) Instructions Provided: Other (comment)   Donald Pore 05/16/2020, 7:23 PM

## 2020-05-16 NOTE — Anesthesia Preprocedure Evaluation (Addendum)
Anesthesia Evaluation  Patient identified by MRN, date of birth, ID band Patient awake    Reviewed: Allergy & Precautions, H&P , NPO status , Patient's Chart, lab work & pertinent test results  Airway Mallampati: II  TM Distance: >3 FB Neck ROM: Full    Dental no notable dental hx. (+) Teeth Intact, Dental Advisory Given   Pulmonary asthma ,    Pulmonary exam normal breath sounds clear to auscultation       Cardiovascular negative cardio ROS   Rhythm:Regular Rate:Normal     Neuro/Psych negative neurological ROS  negative psych ROS   GI/Hepatic negative GI ROS, Neg liver ROS,   Endo/Other  negative endocrine ROS  Renal/GU negative Renal ROS  negative genitourinary   Musculoskeletal  (+) Arthritis , Osteoarthritis,    Abdominal   Peds  Hematology  (+) Blood dyscrasia, anemia ,   Anesthesia Other Findings   Reproductive/Obstetrics negative OB ROS                            Anesthesia Physical Anesthesia Plan  ASA: II  Anesthesia Plan: MAC and Regional   Post-op Pain Management:    Induction: Intravenous  PONV Risk Score and Plan: 2 and Propofol infusion and Ondansetron  Airway Management Planned: Simple Face Mask  Additional Equipment:   Intra-op Plan:   Post-operative Plan:   Informed Consent: I have reviewed the patients History and Physical, chart, labs and discussed the procedure including the risks, benefits and alternatives for the proposed anesthesia with the patient or authorized representative who has indicated his/her understanding and acceptance.     Dental advisory given  Plan Discussed with: CRNA  Anesthesia Plan Comments:         Anesthesia Quick Evaluation

## 2020-05-16 NOTE — Op Note (Signed)
Operative note 05/16/2020  Roseanne Kaufman MD  Preoperative diagnosis: Left distal radius fracture comminuted complex greater than 3 part intra-articular  Postop diagnosis: Same  Procedure: #1 left open reduction internal fixation comminuted complex distal radius fracture with DVR [employer] cross lock plate and screw construct.  This was a greater than 3 part intra-articular fracture.  #2 AP lateral and oblique x-rays performed examined and interpreted by myself  Darryon Bastin MD  Anesthesia:block with IV sedation  Estimated blood loss minimal  Complications none immediate  Operative indications the patient presents for evaluation and surgical care.  Patient understands risk benefits and desires to proceed.  We have discussed with the patient all issues plans and concerns with this in mind we will proceed accordingly. We are planning surgery for your upper extremity. The risk and benefits of surgery to include risk of bleeding, infection, anesthesia,  damage to normal structures and failure of the surgery to accomplish its intended goals of relieving symptoms and restoring function have been discussed in detail. With this in mind we plan to proceed. I have specifically discussed with the patient the pre-and postoperative regime and the dos and don'ts and risk and benefits in great detail. Risk and benefits of surgery also include risk of dystrophy(CRPS), chronic nerve pain, failure of the healing process to go onto completion and other inherent risks of surgery The relavent the pathophysiology of the disease/injury process, as well as the alternatives for treatment and postoperative course of action has been discussed in great detail with the patient who desires to proceed.  We will do everything in our power to help you (the patient) restore function to the upper extremity. It is a pleasure to see this patient today.    Operative procedure: Patient was seen by myself and anesthesia.  Appropriate  anesthesia was induced and following this the patient was prepped with a Hibiclens pre-scrub followed by 10-minute surgical Betadine scrub and paint.  Once this was completed the extremity was elevated and the tourniquet was insufflated to 250 mmHg.  Timeout was observed preoperative antibiotics were given and the patient then underwent a very careful and cautious approach to the extremity with volar radial incision under 250 mm tourniquet control.  FCR tendon sheath was identified and dissected.  There were no complicating features.  Once this was completed the carpal canal contents were retracted ulnarly and the FCR was retracted radially.  We took very meticulous care of the radial artery and the carpal canal contents during the approach.  The pronator was accessed incised and lifted off of the fracture.  The fracture was then reassembled with standard orthopedic equipment and a DVR plate and screw construct from [employer] was accomplished in terms of placement and fixation of the fracture.  This was a narrow DVR plate short in nature.  This fit the patient's bone size perfectly.  Adequate radial height, volar tilt and radial inclination was restored.  The distal radial ulnar joint, radiocarpal and midcarpal joints all were  stable and satisfactory.  We irrigated copiously and closed the pronator with 3-0 Vicryl followed by closure of the skin edge with Prolene.  Once again, the distal radius underwent open reduction internal fixation without complications.  The distal radial ulnar joint was stable.  The patient had no complications.  All radiographic parameters look quite well following the fixation.  Standard dressing of Adaptic Xeroform 4 x 4's gauze web roll Kerlix and a volar splint were applied.  The patient understands instructions of elevate move  massage fingers notify us any problems occur and follow-up care according to our standard protocol for a DVR plate and screw construct.  He has been  a pleasure participate in the patient's care and we look forward to spent in the patient's recovery. I discussed all issues with the patient sister who will be helping her regarding the postoperative care and we discussed instructions extensively. Dominica Severin MD

## 2020-05-16 NOTE — Transfer of Care (Signed)
Immediate Anesthesia Transfer of Care Note  Patient: Rashae Rother  Procedure(s) Performed: OPEN REDUCTION INTERNAL FIXATION (ORIF) DISTAL RADIUS FRACTURE AND REPAIR AS NECESSARY (Left )  Patient Location: PACU  Anesthesia Type:MAC combined with regional for post-op pain  Level of Consciousness: awake, alert  and oriented  Airway & Oxygen Therapy: Patient Spontanous Breathing  Post-op Assessment: Report given to RN and Post -op Vital signs reviewed and stable  Post vital signs: Reviewed and stable  Last Vitals:  Vitals Value Taken Time  BP 59/49 05/16/20 1847  Temp    Pulse 73 05/16/20 1847  Resp 10 05/16/20 1847  SpO2 100 % 05/16/20 1847  Vitals shown include unvalidated device data.  Last Pain:  Vitals:   05/16/20 1845  TempSrc:   PainSc: (P) 0-No pain      Patients Stated Pain Goal: 3 (14/10/30 1314)  Complications: No complications documented.

## 2020-05-16 NOTE — Discharge Instructions (Signed)
Please make sure to elevate move and massage her fingers.  Your block will wear off in the next 12 to 24 hours typically and you will experience pain.  This is very normal.  This is the time where is very important to elevate move and massage her fingers.  Do not forget to take your stool softener twice a day and to also consider milk of magnesia at night to prevent constipation.  Keep bandage clean and dry.  Call for any problems.  No smoking.  Criteria for driving a car: you should be off your pain medicine for 7-8 hours, able to drive one handed(confident), thinking clearly and feeling able in your judgement to drive. Continue elevation as it will decrease swelling.  If instructed by MD move your fingers within the confines of the bandage/splint.  Use ice if instructed by your MD. Call immediately for any sudden loss of feeling in your hand/arm or change in functional abilities of the extremity.We recommend that you to take vitamin C 1000 mg a day to promote healing. We also recommend that if you require  pain medicine that you take a stool softener to prevent constipation as most pain medicines will have constipation side effects. We recommend either Peri-Colace or Senokot and recommend that you also consider adding MiraLAX as well to prevent the constipation affects from pain medicine if you are required to use them. These medicines are over the counter and may be purchased at a local pharmacy. A cup of yogurt and a probiotic can also be helpful during the recovery process as the medicines can disrupt your intestinal environment.

## 2020-05-16 NOTE — Anesthesia Procedure Notes (Signed)
Anesthesia Regional Block: Supraclavicular block   Pre-Anesthetic Checklist: ,, timeout performed, Correct Patient, Correct Site, Correct Laterality, Correct Procedure, Correct Position, site marked, Risks and benefits discussed, pre-op evaluation,  At surgeon's request and post-op pain management  Laterality: Left  Prep: Maximum Sterile Barrier Precautions used, chloraprep       Needles:  Injection technique: Single-shot  Needle Type: Echogenic Stimulator Needle     Needle Length: 5cm  Needle Gauge: 22     Additional Needles:   Procedures:,,,, ultrasound used (permanent image in chart),,,,  Narrative:  Start time: 05/16/2020 4:25 PM End time: 05/16/2020 4:35 PM Injection made incrementally with aspirations every 5 mL. Anesthesiologist: Gaynelle Adu, MD  Additional Notes: 2% Lidocaine skin wheel.

## 2020-05-16 NOTE — H&P (Signed)
Allison Beck is an 59 y.o. female.   Chief Complaint: Left distal radius fracture displaced angulated presents for ORIF HPI: Patient presents for evaluation and treatment of the of their upper extremity predicament. The patient denies neck, back, chest or  abdominal pain. The patient notes that they have no lower extremity problems. The patients primary complaint is noted. We are planning surgical care pathway for the upper extremity.  Past Medical History:  Diagnosis Date  . Anemia   . Arthritis    osteoporosis  . Asthma    no problems, no inhaler  . GERD (gastroesophageal reflux disease)    diet controlled, no meds  . Osteopenia    2013 DEXA  . Pneumonia 1996  . Vitamin D deficiency     Past Surgical History:  Procedure Laterality Date  . BACK SURGERY  2010   Fusion   . COLONOSCOPY    . KNEE ARTHROSCOPY Right   . PARTIAL HYSTERECTOMY  2009  . TONSILLECTOMY     Childhood    Family History  Problem Relation Age of Onset  . Heart disease Mother   . Stroke Mother   . Depression Mother   . Hypertension Mother   . Alzheimer's disease Father   . Hypertension Sister    Social History:  reports that she has never smoked. She has never used smokeless tobacco. She reports that she does not drink alcohol and does not use drugs.  Allergies:  Allergies  Allergen Reactions  . Asa [Aspirin] Shortness Of Breath  . Omnicef [Cefdinir] Other (See Comments)    unknown  . Penicillins Other (See Comments)    unknown  . Sulfa Antibiotics Rash    Medications Prior to Admission  Medication Sig Dispense Refill  . b complex vitamins tablet Take 1 tablet by mouth daily. Shaklee    . cephALEXin (KEFLEX) 500 MG capsule Take 500 mg by mouth 4 (four) times daily.    . Cholecalciferol (VITAMIN D3) 50 MCG (2000 UT) TABS Take 2,000 Units by mouth in the morning and at bedtime. Shaklee    . Evening Primrose Oil (GLA-45 PO) Take 1 tablet by mouth daily.    . Lecithin GRAN Take 6.2 g by  mouth daily. Shaklee    . methocarbamol (ROBAXIN) 500 MG tablet Take 500 mg by mouth every 6 (six) hours as needed.    . Multiple Vitamins-Minerals (MULTIVITAMIN WITH MINERALS) tablet Take 1 tablet by mouth in the morning and at bedtime. Vitalee gold with Vitamin K Shaklee    . ondansetron (ZOFRAN-ODT) 8 MG disintegrating tablet Take 8 mg by mouth every 8 (eight) hours as needed for nausea or vomiting.     Marland Kitchen OVER THE COUNTER MEDICATION Take 2 tablets by mouth in the morning and at bedtime. Omega Guard/ Shaklee Omega 3 fatty acid 667 mg EPA 363 mg DHA 240 mg    . OVER THE COUNTER MEDICATION Take 1 tablet by mouth in the morning and at bedtime. Nutriferon Immunity    . OVER THE COUNTER MEDICATION Take 2-3 tablets by mouth daily. Pain relief complex Shaklee    . oxyCODONE (OXY IR/ROXICODONE) 5 MG immediate release tablet Take 5 mg by mouth every 4 (four) hours as needed.    . Turmeric (QC TUMERIC COMPLEX) 500 MG CAPS Take 1,500 mg by mouth daily. Black pepper abstract/Shaklee    . Zinc Sulfate (ZINC 15 PO) Take 15 mg by mouth daily. Shaklee      Results for orders placed or performed during  the hospital encounter of 05/16/20 (from the past 48 hour(s))  CBC     Status: None   Collection Time: 05/16/20  2:57 PM  Result Value Ref Range   WBC 6.1 4.0 - 10.5 K/uL   RBC 4.83 3.87 - 5.11 MIL/uL   Hemoglobin 14.8 12.0 - 15.0 g/dL   HCT 45.4 36 - 46 %   MCV 94.0 80.0 - 100.0 fL   MCH 30.6 26.0 - 34.0 pg   MCHC 32.6 30.0 - 36.0 g/dL   RDW 12.7 11.5 - 15.5 %   Platelets 224 150 - 400 K/uL   nRBC 0.0 0.0 - 0.2 %    Comment: Performed at [facility], [place] [address], [place], [place] [address]   No results found.  Review of Systems  Respiratory: Negative.   Cardiovascular: Negative.   Gastrointestinal: Negative.   Endocrine: Negative.     Blood pressure (!) 97/35, pulse 78, temperature 98 F (36.7 C), temperature source Oral, resp. rate 19, height 5\' 2"  (1.575 m), weight 52.2 kg, last  menstrual period 12/03/2007, SpO2 100 %. Physical Exam displaced angulated complex comminuted grade 3 part distal radius fracture.  Will plan for open reduction internal fixation.  She has good refill.  Her block placed by anesthesia is in place.  I examined her yesterday in the office and she was fairly stable in terms of her vascular and neurologic status.  The patient is alert and oriented in no acute distress. The patient complains of pain in the affected upper extremity.  The patient is noted to have a normal HEENT exam. Lung fields show equal chest expansion and no shortness of breath. Abdomen exam is nontender without distention. Lower extremity examination does not show any fracture dislocation or blood clot symptoms. Pelvis is stable and the neck and back are stable and nontender. Assessment/Plan We will plan for open reduction internal fixation left distal radius fracture with repair is necessary.  I have counseled the patient in regards to risk benefits of surgery.  We are planning surgery for your upper extremity. The risk and benefits of surgery to include risk of bleeding, infection, anesthesia,  damage to normal structures and failure of the surgery to accomplish its intended goals of relieving symptoms and restoring function have been discussed in detail. With this in mind we plan to proceed. I have specifically discussed with the patient the pre-and postoperative regime and the dos and don'ts and risk and benefits in great detail. Risk and benefits of surgery also include risk of dystrophy(CRPS), chronic nerve pain, failure of the healing process to go onto completion and other inherent risks of surgery The relavent the pathophysiology of the disease/injury process, as well as the alternatives for treatment and postoperative course of action has been discussed in great detail with the patient who desires to proceed.  We will do everything in our power to help you (the patient)  restore function to the upper extremity. It is a pleasure to see this patient today.   Willa Frater III, MD 05/16/2020, 5:25 PM

## 2020-05-16 NOTE — Anesthesia Postprocedure Evaluation (Signed)
Anesthesia Post Note  Patient: Allison Beck  Procedure(s) Performed: OPEN REDUCTION INTERNAL FIXATION (ORIF) DISTAL RADIUS FRACTURE AND REPAIR AS NECESSARY (Left )     Patient location during evaluation: PACU Anesthesia Type: Regional Level of consciousness: awake and alert Pain management: pain level controlled Vital Signs Assessment: post-procedure vital signs reviewed and stable Respiratory status: spontaneous breathing, nonlabored ventilation and respiratory function stable Cardiovascular status: stable and blood pressure returned to baseline Anesthetic complications: no   No complications documented.  Last Vitals:  Vitals:   05/16/20 1900 05/16/20 1914  BP: (!) 117/93 112/79  Pulse: 84 86  Resp: 18 15  Temp:  36.4 C  SpO2: 94% 100%    Last Pain:  Vitals:   05/16/20 1914  TempSrc:   PainSc: 0-No pain                 Beryle Lathe

## 2020-05-16 NOTE — Anesthesia Procedure Notes (Signed)
Procedure Name: MAC Date/Time: 05/16/2020 5:45 PM Performed by: Moshe Salisbury, CRNA Pre-anesthesia Checklist: Patient identified, Emergency Drugs available, Suction available, Patient being monitored and Timeout performed Patient Re-evaluated:Patient Re-evaluated prior to induction Oxygen Delivery Method: Nasal cannula Placement Confirmation: positive ETCO2 Dental Injury: Teeth and Oropharynx as per pre-operative assessment

## 2020-05-17 ENCOUNTER — Encounter (HOSPITAL_COMMUNITY): Payer: Self-pay | Admitting: Orthopedic Surgery

## 2020-10-18 ENCOUNTER — Encounter: Payer: No Typology Code available for payment source | Admitting: Physician Assistant

## 2021-03-13 ENCOUNTER — Ambulatory Visit (INDEPENDENT_AMBULATORY_CARE_PROVIDER_SITE_OTHER): Payer: No Typology Code available for payment source | Admitting: Allergy and Immunology

## 2021-03-13 ENCOUNTER — Encounter: Payer: Self-pay | Admitting: Allergy and Immunology

## 2021-03-13 ENCOUNTER — Other Ambulatory Visit: Payer: Self-pay

## 2021-03-13 ENCOUNTER — Ambulatory Visit (HOSPITAL_COMMUNITY)
Admission: RE | Admit: 2021-03-13 | Discharge: 2021-03-13 | Disposition: A | Discharge status: Home / Self Care | Payer: No Typology Code available for payment source | Source: Ambulatory Visit | Attending: Allergy and Immunology | Admitting: Allergy and Immunology

## 2021-03-13 VITALS — BP 114/76 | HR 79 | Temp 98.7°F | Resp 16 | Ht 62.0 in | Wt 115.8 lb

## 2021-03-13 DIAGNOSIS — R059 Cough, unspecified: Secondary | ICD-10-CM | POA: Diagnosis present

## 2021-03-13 DIAGNOSIS — J4530 Mild persistent asthma, uncomplicated: Secondary | ICD-10-CM | POA: Diagnosis not present

## 2021-03-13 DIAGNOSIS — K219 Gastro-esophageal reflux disease without esophagitis: Secondary | ICD-10-CM

## 2021-03-13 MED ORDER — METHYLPREDNISOLONE ACETATE 80 MG/ML IJ SUSP
80.0000 mg | Freq: Once | INTRAMUSCULAR | Status: AC
  Administered 2021-03-13: 80 mg via INTRAMUSCULAR

## 2021-03-13 MED ORDER — ARNUITY ELLIPTA 200 MCG/ACT IN AEPB
1.0000 | INHALATION_SPRAY | Freq: Every day | RESPIRATORY_TRACT | 5 refills | Status: DC
Start: 2021-03-13 — End: 2023-08-12

## 2021-03-13 NOTE — Patient Instructions (Addendum)
  1.  Allergen avoidance measures?  2.  Treat and prevent inflammation:   A. Depomedrol 80 IM delivered in clinic today  B. Arnuity 200 - 1 inhalation 1 time per day  3.  Treat and prevent reflux/LPR:   A. Consolidate chocolate consumption  B. Omeprazole 40 mg - 1 tablet 1 time per day  C. Famotidine 40 mg - 1 tabklet 1 time per day  4.  If needed:   A. OTC antihistamine  B. Albuterol HFA - 2 inhalations every 4-6 hours  5.  Obtain a chest x-ray  6.  Return to clinic in 3 weeks or earlier if problem

## 2021-03-13 NOTE — Progress Notes (Signed)
- [place] - [place] - [place] - Allison Beck   Dear Dr. Hal Beck,  Thank you for referring Allison Beck to the [facility] [place] on 03/13/2021.   Below is a summation of this patient's evaluation and recommendations.  Thank you for your referral. I will keep you informed about this patient's response to treatment.   If you have any questions please do not hesitate to contact me.   Sincerely,  Allison Priest, MD Allergy / Immunology [place] [facility]   ______________________________________________________________________    NEW PATIENT NOTE  Referring Provider: Dois Davenport, MD Primary Provider: Dois Davenport, MD Date of office visit: 03/13/2021    Subjective:   Chief Complaint:  Allison Beck (DOB: [birth year]) is a 60 y.o. female who presents to the clinic on 03/13/2021 with a chief complaint of Asthma (PCP gave her a proair inhaler and airduo 113/14 mcg inhaler and both do not help with cough or asthma symptoms ) and Cough (Constant cough with no relief ) .     HPI: Allison Beck presents to this clinic in evaluation of cough.  I had seen her in this clinic in 2017 for an issue with asthma and allergic rhinitis and prior to that there was an issue of LPR.  She states that she was doing very well for many years until December 2021.  At that point time she has had coughing.  She has spells of cough associated with gagging and retching and almost vomits.  Her cough does disturb her sleep.  She may have some intermittent shortness of breath and intermittent chest tightness but she still able to exercise without much difficulty at all.  She performs on a elliptical trainer and a bicycle without much difficulty.  She occasionally does get a chest pain and some stomach pain and has some gagging.  She occasionally does get some nasal congestion and clear rhinorrhea without any  anosmia or ugly nasal discharge.  She does consume chocolate on a daily basis.  She has apparently been treated with multiple agents in the past several months including an inhaled bronchodilator which has not helped her.  She has been given a combination inhaler which has not helped her.  She has not really had a significant environmental change that can account for this issue.  She is not on a ACE inhibitor.  Past Medical History:  Diagnosis Date  . Anemia   . Arthritis    osteoporosis  . Asthma    no problems, no inhaler  . GERD (gastroesophageal reflux disease)    diet controlled, no meds  . Osteopenia    2013 DEXA  . Pneumonia 1996  . Vitamin D deficiency     Past Surgical History:  Procedure Laterality Date  . ADENOIDECTOMY    . BACK SURGERY  2010   Fusion   . COLONOSCOPY    . KNEE ARTHROSCOPY Right   . OPEN REDUCTION INTERNAL FIXATION (ORIF) DISTAL RADIAL FRACTURE Left 05/16/2020   Procedure: OPEN REDUCTION INTERNAL FIXATION (ORIF) DISTAL RADIUS FRACTURE AND REPAIR AS NECESSARY;  Surgeon: Allison Severin, MD;  Location: [facility] OR;  Service: Orthopedics;  Laterality: Left;  Block with IV Sed 90 mins  . PARTIAL HYSTERECTOMY  2009  . TONSILLECTOMY     Childhood    Allergies as of 03/13/2021      Reactions   Asa [aspirin] Shortness Of Breath   Omnicef [cefdinir] Other (See Comments)  unknown   Penicillins Other (See Comments)   unknown   Sulfa Antibiotics Rash      Medication List      b complex vitamins tablet Take 1 tablet by mouth daily. Shaklee   denosumab 60 MG/ML Sosy injection Commonly known as: PROLIA Inject 60 mg into the skin every 6 (six) months.   GLA-45 PO Take 1 tablet by mouth daily.   Lecithin Gran Take 6.2 g by mouth daily. Shaklee   methocarbamol 500 MG tablet Commonly known as: ROBAXIN Take 500 mg by mouth every 6 (six) hours as needed.   multivitamin with minerals tablet Take 1 tablet by mouth in the morning and at bedtime. Vitalee  gold with Vitamin K Shaklee   ondansetron 8 MG disintegrating tablet Commonly known as: ZOFRAN-ODT Take 8 mg by mouth every 8 (eight) hours as needed for nausea or vomiting.   OVER THE COUNTER MEDICATION Take 2 tablets by mouth in the morning and at bedtime. Omega Guard/ Shaklee Omega 3 fatty acid 667 mg EPA 363 mg DHA 240 mg   OVER THE COUNTER MEDICATION Take 1 tablet by mouth in the morning and at bedtime. Nutriferon Immunity   OVER THE COUNTER MEDICATION Take 2-3 tablets by mouth daily. Pain relief complex Shaklee   oxyCODONE 5 MG immediate release tablet Commonly known as: Oxy IR/ROXICODONE Take 5 mg by mouth every 4 (four) hours as needed.   Turmeric 500 MG Caps Take 1,500 mg by mouth daily. Black pepper abstract/Shaklee   Vitamin D3 50 MCG (2000 UT) Tabs Take 2,000 Units by mouth in the morning and at bedtime. Shaklee   ZINC 15 PO Take 15 mg by mouth daily. Shaklee       Review of systems negative except as noted in HPI / PMHx or noted below:  Review of Systems  Constitutional: Negative.   HENT: Negative.   Eyes: Negative.   Respiratory: Negative.   Cardiovascular: Negative.   Gastrointestinal: Negative.   Genitourinary: Negative.   Musculoskeletal: Negative.   Skin: Negative.   Neurological: Negative.   Endo/Heme/Allergies: Negative.   Psychiatric/Behavioral: Negative.     Family History  Problem Relation Age of Onset  . Heart disease Mother   . Stroke Mother   . Depression Mother   . Hypertension Mother   . Alzheimer's disease Father   . Hypertension Sister     Social History   Socioeconomic History  . Marital status: Single    Spouse name: Not on file  . Number of children: Not on file  . Years of education: Not on file  . Highest education level: Not on file  Occupational History  . Not on file  Tobacco Use  . Smoking status: Never Smoker  . Smokeless tobacco: Never Used  Vaping Use  . Vaping Use: Never used  Substance and Sexual  Activity  . Alcohol use: No  . Drug use: No  . Sexual activity: Not on file    Comment: Hysterectomy  Other Topics Concern  . Not on file  Social History Narrative  . Not on file   Environmental and Social history  Lives in a condominium with a dry environment, dog look inside the household, carpet in the bedroom, plastic on the bed, plastic on the pillow, no smoking ongoing inside the household.  Objective:   Vitals:   03/13/21 1335  BP: 114/76  Pulse: 79  Resp: 16  Temp: 98.7 F (37.1 C)  SpO2: 97%   Height: 5\' 2"  (157.5  cm) Weight: 115 lb 12.8 oz (52.5 kg)  Physical Exam Constitutional:      Appearance: She is not diaphoretic.     Comments: Throat clearing  HENT:     Head: Normocephalic.     Right Ear: Tympanic membrane, ear canal and external ear normal.     Left Ear: Tympanic membrane, ear canal and external ear normal.     Nose: Nose normal. No mucosal edema or rhinorrhea.     Mouth/Throat:     Pharynx: Uvula midline. No oropharyngeal exudate.  Eyes:     Conjunctiva/sclera: Conjunctivae normal.  Neck:     Thyroid: No thyromegaly.     Trachea: Trachea normal. No tracheal tenderness or tracheal deviation.  Cardiovascular:     Rate and Rhythm: Normal rate and regular rhythm.     Heart sounds: Normal heart sounds, S1 normal and S2 normal. No murmur heard.   Pulmonary:     Effort: No respiratory distress.     Breath sounds: Normal breath sounds. No stridor. No wheezing or rales.  Lymphadenopathy:     Head:     Right side of head: No tonsillar adenopathy.     Left side of head: No tonsillar adenopathy.     Cervical: No cervical adenopathy.  Skin:    Findings: No erythema or rash.     Nails: There is no clubbing.  Neurological:     Mental Status: She is alert.     Diagnostics: Allergy skin tests were not performed.   Spirometry was performed and demonstrated an FEV1 of 2.44 @ 105 % of predicted. FEV1/FVC = 0.79 following the administration of inhaled  albuterol her FEV1 did not improve.  Assessment and Plan:    1. Not well controlled mild persistent asthma   2. LPRD (laryngopharyngeal reflux disease)   3. Cough     1.  Allergen avoidance measures?  2.  Treat and prevent inflammation:   A. Depomedrol 80 IM delivered in clinic today  B. Arnuity 200 - 1 inhalation 1 time per day  3.  Treat and prevent reflux/LPR:   A. Consolidate chocolate consumption  B. Omeprazole 40 mg - 1 tablet 1 time per day  C. Famotidine 40 mg - 1 tabklet 1 time per day  4.  If needed:   A. OTC antihistamine  B. Albuterol HFA - 2 inhalations every 4-6 hours  5.  Obtain a chest x-ray  6.  Return to clinic in 3 weeks or earlier if problem  Arial has a irritated and possibly inflamed airway and there may be a atopic component but I also suspect that there is a reflux component to her respiratory tract inflammation and irritation and we will treat her with the therapy noted above to address both inflammation and reflux induced respiratory disease.  I will obtain a chest x-ray as a screening tool to exclude worrisome disease states contributing to her cough.  I will see her back in this clinic in 3 weeks or earlier if there is a problem.  Allison Priest, MD Allergy / Immunology [place] [facility] [place]

## 2021-03-14 ENCOUNTER — Encounter: Payer: Self-pay | Admitting: Allergy and Immunology

## 2021-04-10 ENCOUNTER — Other Ambulatory Visit: Payer: Self-pay

## 2021-04-10 ENCOUNTER — Encounter: Payer: Self-pay | Admitting: Allergy and Immunology

## 2021-04-10 ENCOUNTER — Ambulatory Visit (INDEPENDENT_AMBULATORY_CARE_PROVIDER_SITE_OTHER): Payer: No Typology Code available for payment source | Admitting: Allergy and Immunology

## 2021-04-10 VITALS — BP 128/80 | HR 86 | Temp 98.7°F | Resp 16 | Ht 62.0 in | Wt 113.2 lb

## 2021-04-10 DIAGNOSIS — J4530 Mild persistent asthma, uncomplicated: Secondary | ICD-10-CM

## 2021-04-10 DIAGNOSIS — K219 Gastro-esophageal reflux disease without esophagitis: Secondary | ICD-10-CM

## 2021-04-10 MED ORDER — FAMOTIDINE 40 MG PO TABS: 40.0000 mg | ORAL_TABLET | Freq: Every day | ORAL | 3 refills | Status: DC

## 2021-04-10 MED ORDER — OMEPRAZOLE MAGNESIUM 20 MG PO TBEC: 40.0000 mg | DELAYED_RELEASE_TABLET | Freq: Every day | ORAL | 3 refills | Status: DC

## 2021-04-10 NOTE — Patient Instructions (Addendum)
  1.  Treat and prevent inflammation:   A. Arnuity 200 - 1 inhalation 1 time per day  2.  Treat and prevent reflux/LPR:   A. Consolidate chocolate consumption. Discontinue fish oil  B. START Omeprazole 40 mg - 1 tablet 1 time per day in AM  C. START Famotidine 40 mg - 1 tablet 1 time per day in PM  3.  If needed:   A. OTC antihistamine - Zyrtec / Claritin  B. Albuterol HFA - 2 inhalations every 4-6 hours  4. Return to clinic in 8 weeks or earlier if problem

## 2021-04-10 NOTE — Progress Notes (Signed)
[place] - [place] - [place] - [place] - [place]   Follow-up Note  Referring Provider: Dois Davenport, MD Primary Provider: Dois Davenport, MD Date of Office Visit: 04/10/2021  Subjective:   Allison Beck (DOB: Mar 13, 1961) is a 60 y.o. female who [facility] the [facility] on 04/10/2021 in re-evaluation of the following:  HPI: Allison Beck returns to this clinic in evaluation of asthma and LPR.  Her last visit to this clinic was her initial evaluation of 13 March 2021 at which point time we addressed these issues.  She has better with her cough but it is not gone.  She is maybe 50% better.  She is not gagging and retching with her cough but she still coughs at night and still disturbs her sleep.  She still has drainage in her throat.  She continues to use her inhaled steroid but she has not used any therapy directed against LPR.  She has consolidated her caffeine consumption.  Allergies as of 04/10/2021      Reactions   Asa [aspirin] Shortness Of Breath   Omnicef [cefdinir] Other (See Comments)   unknown   Penicillins Other (See Comments)   unknown   Sulfa Antibiotics Rash      Medication List    Arnuity Ellipta 200 MCG/ACT Aepb Generic drug: Fluticasone Furoate Inhale 1 puff into the lungs daily.   b complex vitamins tablet Take 1 tablet by mouth daily. Shaklee   denosumab 60 MG/ML Sosy injection Commonly known as: PROLIA Inject 60 mg into the skin every 6 (six) months.   Lecithin Gran Take 6.2 g by mouth daily. Shaklee   multivitamin with minerals tablet Take 1 tablet by mouth in the morning and at bedtime. Vitalee gold with Vitamin K Shaklee   OVER THE COUNTER MEDICATION Take 2 tablets by mouth in the morning and at bedtime. Omega Guard/ Shaklee Omega 3 fatty acid 667 mg EPA 363 mg DHA 240 mg   OVER THE COUNTER MEDICATION Take 1 tablet by mouth in the morning and at bedtime. Nutriferon Immunity   OVER THE COUNTER  MEDICATION Take 2-3 tablets by mouth daily. Pain relief complex Shaklee   oxyCODONE 5 MG immediate release tablet Commonly known as: Oxy IR/ROXICODONE Take 5 mg by mouth every 4 (four) hours as needed.   Turmeric 500 MG Caps Take 1,500 mg by mouth daily. Black pepper abstract/Shaklee   Vitamin D3 50 MCG (2000 UT) Tabs Take 2,000 Units by mouth in the morning and at bedtime. Shaklee   ZINC 15 PO Take 15 mg by mouth daily. Shaklee       Past Medical History:  Diagnosis Date  . Anemia   . Arthritis    osteoporosis  . Asthma    no problems, no inhaler  . GERD (gastroesophageal reflux disease)    diet controlled, no meds  . Osteopenia    2013 DEXA  . Pneumonia 1996  . Vitamin D deficiency     Past Surgical History:  Procedure Laterality Date  . ADENOIDECTOMY    . BACK SURGERY  2010   Fusion   . COLONOSCOPY    . KNEE ARTHROSCOPY Right   . OPEN REDUCTION INTERNAL FIXATION (ORIF) DISTAL RADIAL FRACTURE Left 05/16/2020   Procedure: OPEN REDUCTION INTERNAL FIXATION (ORIF) DISTAL RADIUS FRACTURE AND REPAIR AS NECESSARY;  Surgeon: Dominica Severin, MD;  Location: [facility] OR;  Service: Orthopedics;  Laterality: Left;  Block with IV Sed 90 mins  . PARTIAL HYSTERECTOMY  2009  .  TONSILLECTOMY     Childhood    Review of systems negative except as noted in HPI / PMHx or noted below:  Review of Systems  Constitutional: Negative.   HENT: Negative.   Eyes: Negative.   Respiratory: Negative.   Cardiovascular: Negative.   Gastrointestinal: Negative.   Genitourinary: Negative.   Musculoskeletal: Negative.   Skin: Negative.   Neurological: Negative.   Endo/Heme/Allergies: Negative.   Psychiatric/Behavioral: Negative.      Objective:   Vitals:   04/10/21 1343  BP: 128/80  Pulse: 86  Resp: 16  Temp: 98.7 F (37.1 C)  SpO2: 99%   Height: 5\' 2"  (157.5 cm)  Weight: 113 lb 3.2 oz (51.3 kg)   Physical Exam Constitutional:      Appearance: She is not diaphoretic.  HENT:      Head: Normocephalic.     Right Ear: Tympanic membrane, ear canal and external ear normal.     Left Ear: Tympanic membrane, ear canal and external ear normal.     Nose: Nose normal. No mucosal edema or rhinorrhea.     Mouth/Throat:     Pharynx: Uvula midline. No oropharyngeal exudate.  Eyes:     Conjunctiva/sclera: Conjunctivae normal.  Neck:     Thyroid: No thyromegaly.     Trachea: Trachea normal. No tracheal tenderness or tracheal deviation.  Cardiovascular:     Rate and Rhythm: Normal rate and regular rhythm.     Heart sounds: Normal heart sounds, S1 normal and S2 normal. No murmur heard.   Pulmonary:     Effort: No respiratory distress.     Breath sounds: Normal breath sounds. No stridor. No wheezing or rales.  Lymphadenopathy:     Head:     Right side of head: No tonsillar adenopathy.     Left side of head: No tonsillar adenopathy.     Cervical: No cervical adenopathy.  Skin:    Findings: No erythema or rash.     Nails: There is no clubbing.  Neurological:     Mental Status: She is alert.     Diagnostics:   The patient had an Asthma Control Test with the following results: ACT Total Score: 15.    Results of the chest x-ray obtained 13 March 2021 identified the following:  There is interstitial thickening. There is no edema or airspace opacity. Heart size and pulmonary vascularity are normal. No adenopathy. No pneumothorax. No bone lesions.  Assessment and Plan:   1. Not well controlled mild persistent asthma   2. LPRD (laryngopharyngeal reflux disease)     1.  Treat and prevent inflammation:   A. Arnuity 200 - 1 inhalation 1 time per day  2.  Treat and prevent reflux/LPR:   A. Consolidate chocolate consumption. Discontinue fish oil  B. START Omeprazole 40 mg - 1 tablet 1 time per day in AM  C. START Famotidine 40 mg - 1 tablet 1 time per day in PM  3.  If needed:   A. OTC antihistamine - Zyrtec / Claritin  B. Albuterol HFA - 2 inhalations every 4-6  hours  4. Return to clinic in 8 weeks or earlier if problem  Allison Beck needs to treat her LPR with the therapy noted above.  Although she has shown some improvement with the use of Arnuity and the administration of systemic steroid she still has many symptoms suggestive of LPR.  Before we pursue any further evaluation for other etiologic factors responsible for cough she needs to treat this condition  and then will have a much better idea about what direction to move forward regarding her respiratory tract issue.  I will see her back in this clinic in 8 weeks or earlier if there is a problem.  Laurette Schimke, MD Allergy / Immunology [facility]

## 2021-04-11 ENCOUNTER — Encounter: Payer: Self-pay | Admitting: Allergy and Immunology

## 2021-04-24 ENCOUNTER — Telehealth: Payer: Self-pay

## 2021-04-24 NOTE — Telephone Encounter (Signed)
Spoke with patient, she is going to have them fax the results over to the [place] office.

## 2021-04-24 NOTE — Telephone Encounter (Signed)
Patient called and stated that when she went to her PCP for lab work her PCP informed her that her red blood cells were elevated. Patient wants to know if this will cause her asthma to flare or interfere with her asthma. Please advise.

## 2021-04-24 NOTE — Telephone Encounter (Signed)
Please asked Novalynn to get [place] a copy of those lab tests.

## 2021-05-03 NOTE — Telephone Encounter (Signed)
Patient sates she had lab work done as she has felt foggy. Patient looked at her TSH levels and they have been a bit high.   March (had labs done twice) 1st time- 3.25 2nd Time-5.3 April - 6.79 May- 5.5  Patient would like to know if TSH affects her asthma in any way.   Patient also would like to know if Dr. Lucie Leather received her lab results for her red blood cells.   Best contact number: [phone]  Please advise.

## 2021-05-03 NOTE — Telephone Encounter (Signed)
Please inform Allison Beck that this thyroid issue does not interact with her asthma.  I do not have access to any of her blood tests.  I do not believe that we ever received those results.

## 2021-05-03 NOTE — Telephone Encounter (Signed)
Called and informed patient of her thyroid and asthma interactions. Patient stated that she isn't pleases with her PCP and the care she is receiving. Patient stated that she would drop off labs and results in the [facility] office tomorrow June 3. Patient verbalized understanding of her interactions. No other questions or concerns were addressed during this call.

## 2021-05-04 NOTE — Telephone Encounter (Signed)
Patient came in and dropped off lab results. I have placed them in Dr. Kathyrn Lass office.

## 2021-06-12 ENCOUNTER — Telehealth: Payer: Self-pay

## 2021-06-12 NOTE — Telephone Encounter (Signed)
Patient called stating the two medications below are not helping with the drainage in her throat.  In the Morning--Omeprazole (2 Pills)   At Night-Famotidine (1 Pill)  Patient is wondering what else she can try to get rid of the drainage in the throat?   [facility]

## 2021-06-13 NOTE — Telephone Encounter (Signed)
Is her cough gone? Is it only her drainage that remains?

## 2021-06-13 NOTE — Telephone Encounter (Signed)
Lm for pt to call us back need to know if she has drainage and a cough

## 2021-06-13 NOTE — Telephone Encounter (Signed)
Patient called back, cough has improved but still coughing some, states it is a whimpy cough. Drainage is somewhat better, but still there.

## 2021-06-13 NOTE — Telephone Encounter (Signed)
PER CARLA: Patient called back, cough has improved but still coughing some, states it is a whimpy cough. Drainage is somewhat better, but still there.

## 2021-07-03 ENCOUNTER — Ambulatory Visit: Payer: No Typology Code available for payment source | Admitting: Allergy and Immunology

## 2021-07-27 ENCOUNTER — Other Ambulatory Visit: Payer: Self-pay | Admitting: Obstetrics and Gynecology

## 2021-07-27 DIAGNOSIS — N6310 Unspecified lump in the right breast, unspecified quadrant: Secondary | ICD-10-CM

## 2021-08-13 ENCOUNTER — Ambulatory Visit
Admission: RE | Admit: 2021-08-13 | Discharge: 2021-08-13 | Disposition: A | Discharge status: Home / Self Care | Payer: No Typology Code available for payment source | Source: Ambulatory Visit | Attending: Obstetrics and Gynecology | Admitting: Obstetrics and Gynecology

## 2021-08-13 ENCOUNTER — Other Ambulatory Visit: Payer: Self-pay

## 2021-08-13 DIAGNOSIS — N6310 Unspecified lump in the right breast, unspecified quadrant: Secondary | ICD-10-CM

## 2022-05-13 IMAGING — US US BREAST*R* LIMITED INC AXILLA
1 series · 2 of 2 positions shown · non-contrast
Comparison: Prior films

CLINICAL DATA: Palpable lump felt by the patient's physician at
right breast 9 o'clock. The patient had a recent normal screening
mammogram on July 27, 2021, therefore, mammogram was not repeated.
Mammogram July 27, 2021 was review today.

EXAM:
ULTRASOUND OF THE right BREAST

[Series 1: us breast*right* limited inc axilla · 0.06mm/px · 2 of 2 slices shown]
[im 1/2]
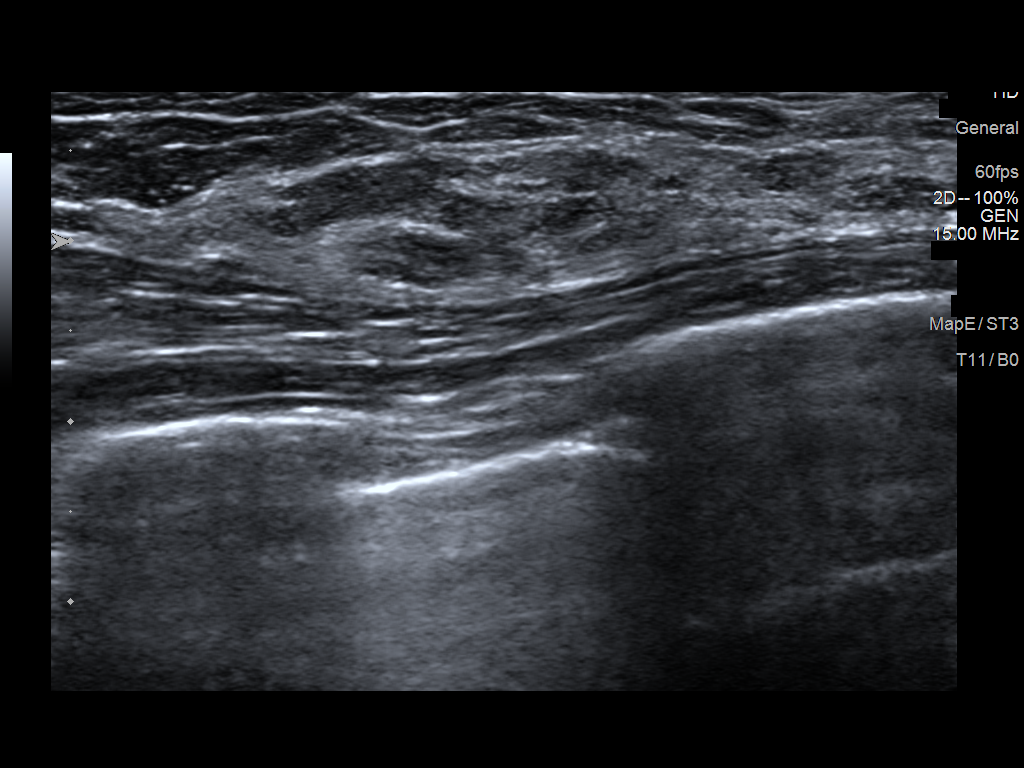
[im 2/2]
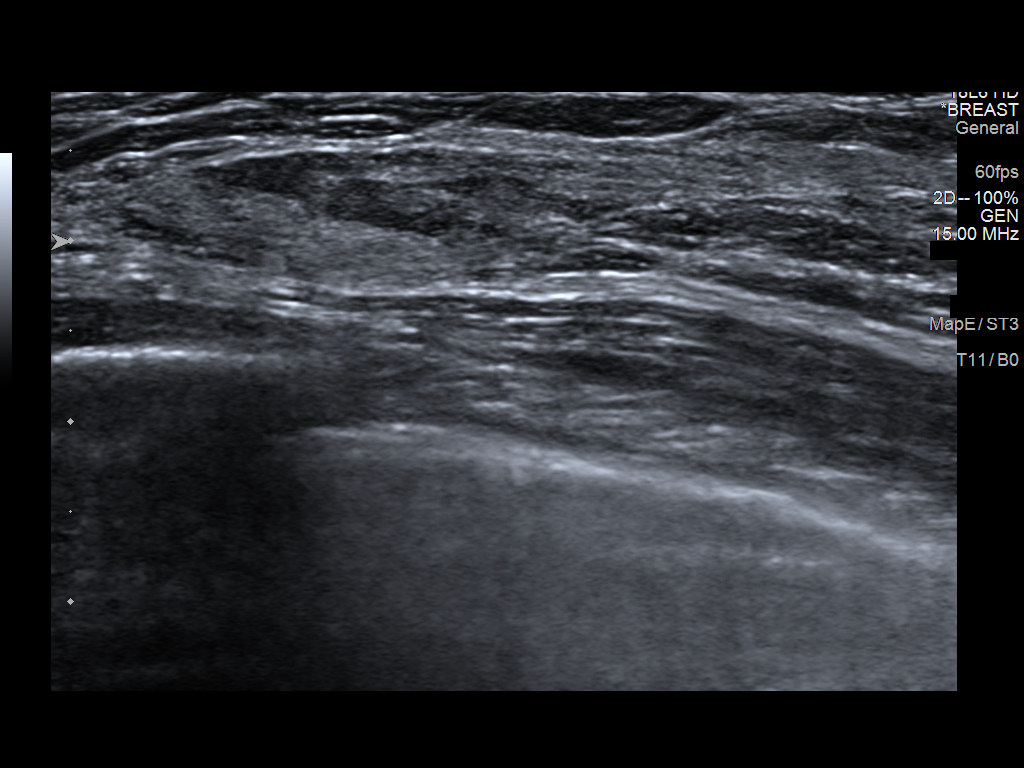

[2 of 2 positions shown; findings below may reference images not displayed]

FINDINGS: Targeted ultrasound is performed, showing no focal abnormal discrete
cystic or solid lesion at the right breast 9 o'clock palpable area.
IMPRESSION: Negative.

RECOMMENDATION:
Routine screening mammogram back on schedule.

I have discussed the findings and recommendations with the patient.
If applicable, a reminder letter will be sent to the patient
regarding the next appointment.

BI-RADS CATEGORY  1: Negative.

## 2023-08-11 NOTE — Progress Notes (Signed)
   I, Brandy Coleman,CMA acting as a Neurosurgeon for Allison Graham, MD.  Allison Beck is a 62 y.o. female who presents to [facility] today for osteoporosis management.   She has osteoporosis with a T-score of -3.04 July 2022 with a history of left foot metatarsal stress fractures x 3 and a left wrist fracture.  She already has had trials of Fosamax and Evista and Prolia and could not tolerate them due to side effects.  She is doing resistance training daily and is taking vitamin D and calcium supplementation.  DEXA scan (date, T-score): 07/29/22: L-FN= -3.3, R-FN= -2.8 Prior treatment: Prolia x 2 years, d/c d/t side effects; Fosamax; Evista History of Hip, Spine, or Wrist Fx: left wrist - fall from standing height.  Additional history of metatarsal stress fractures. Heart disease or stroke: no Cancer: no Kidney Disease: no Gastric/Peptic Ulcer: no Gastric bypass surgery: no Severe GERD: yes Hx of seizures: no Age at Menopause: 61 y/o Calcium intake: yes Vitamin D intake: yes Hormone replacement therapy: no Smoking history: no Alcohol: no Exercise: yes Major dental work in past year: no Parents with hip/spine fracture: mom - hx of bilat hip fracture Height loss: 0.5" height loss      Pertinent review of systems: No fevers or chills  Relevant historical information: Osteoporosis asthma vitamin D deficiency   Exam:  BP 118/78   Pulse 88   Ht 5\' 2"  (1.575 m)   Wt 110 lb 6.4 oz (50.1 kg)   LMP 12/03/2007   SpO2 99%   BMI 20.19 kg/m  General: Well Developed, well nourished, and in no acute distress.   MSK: Mild antalgic gait     Assessment and Plan: 62 y.o. female with osteoporosis with history of fractures with T-score -3.3 already previously treated with alendronate Evista and Prolia.  She is doing a pretty good job of maximizing her conservative management with vitamin D and weightbearing resistance training exercises.  We talked  about the current uncertainty regarding calcium. After discussion we will work on authorization for [employer] and get recent labs obtained at her OB/GYN office. Tymlos is a parathyroid hormone analog which will be an anabolic agent and I think would be potentially quite beneficial for her.  It is an injection she would have to administer herself daily for about 2 years.  Once we get a sense of approval and cost we can give her a sample and she can try it for a month and see if she is gena tolerate it before she buys it.   PDMP not reviewed this encounter. No orders of the defined types were placed in this encounter.  No orders of the defined types were placed in this encounter.    Discussed warning signs or symptoms. Please see discharge instructions. Patient expresses understanding.   The above documentation has been reviewed and is accurate and complete Allison Beck, M.D.  CC: PCP and OB/GYN  Total encounter time 30 minutes including face-to-face time with the patient and, reviewing past medical record, and charting on the date of service.

## 2023-08-12 ENCOUNTER — Ambulatory Visit (INDEPENDENT_AMBULATORY_CARE_PROVIDER_SITE_OTHER): Payer: No Typology Code available for payment source | Admitting: Family Medicine

## 2023-08-12 ENCOUNTER — Telehealth: Payer: Self-pay

## 2023-08-12 ENCOUNTER — Encounter: Payer: Self-pay | Admitting: Family Medicine

## 2023-08-12 VITALS — BP 118/78 | HR 88 | Ht 62.0 in | Wt 110.4 lb

## 2023-08-12 DIAGNOSIS — E559 Vitamin D deficiency, unspecified: Secondary | ICD-10-CM | POA: Diagnosis not present

## 2023-08-12 DIAGNOSIS — M810 Age-related osteoporosis without current pathological fracture: Secondary | ICD-10-CM

## 2023-08-12 NOTE — Patient Instructions (Addendum)
Thank you for coming in today.   We will try to get lab records from your OBGYN office.   We will look at [facility] for osteoporosis.   Keep working on your home exercises and weight bearing exercises.

## 2023-08-12 NOTE — Telephone Encounter (Signed)
Check coverage of Tymlos  Obtain labs results from [facility].   Tymlos clinical Teacher, early years/pre enrollment if appropriate.

## 2023-08-12 NOTE — Telephone Encounter (Signed)
PA initiated via [employer]

## 2023-08-12 NOTE — Telephone Encounter (Signed)
PA initiated

## 2023-08-13 ENCOUNTER — Telehealth: Payer: Self-pay | Admitting: Family Medicine

## 2023-08-13 NOTE — Telephone Encounter (Signed)
Pt seen yesterday, wanted [place] to know she NOT want to take the Tymlos. She would prefer taking a pill and not an injectable.

## 2023-08-13 NOTE — Telephone Encounter (Signed)
Pending

## 2023-08-13 NOTE — Telephone Encounter (Signed)
Allison Beck R3 hours ago (10:03 AM)   JJ Pt seen yesterday, wanted [place] to know she NOT want to take the Tymlos. She would prefer taking a pill and not an injectable.

## 2023-08-14 NOTE — Telephone Encounter (Signed)
Dr. Denyse Amass, would you like to proceed with Fosamax? Pt has been on Fosamax and Boniva in the past.

## 2023-08-15 NOTE — Telephone Encounter (Signed)
Please offer Allison Beck an option of Fosamax or Boniva.  I am happy to prescribe either 1 of these.  She has been on these medicines before so this will be more of the same.

## 2023-08-18 NOTE — Telephone Encounter (Signed)
Allergy/contra-indication alert received.   Forwarding to Dr. Denyse Amass. Rx pending.

## 2023-08-18 NOTE — Addendum Note (Signed)
Addended by: Dierdre Searles on: 08/18/2023 03:17 PM   Modules accepted: Orders

## 2023-08-18 NOTE — Telephone Encounter (Signed)
Think we need to ask her if she wants to take Fosamax if so I okay with prescribing.

## 2023-08-19 MED ORDER — IBANDRONATE SODIUM 150 MG PO TABS: 150.0000 mg | ORAL_TABLET | ORAL | 3 refills | Status: AC

## 2023-08-19 NOTE — Telephone Encounter (Signed)
Spoke with patient yesterday and she was agreeable to starting on Boniva.   Rx sent to pharmacy.

## 2023-08-19 NOTE — Addendum Note (Signed)
Addended by: Dierdre Searles on: 08/19/2023 08:28 AM   Modules accepted: Orders

## 2023-10-18 ENCOUNTER — Emergency Department (HOSPITAL_COMMUNITY)
Admission: EM | Admit: 2023-10-18 | Discharge: 2023-10-18 | Disposition: A | Discharge status: Home / Self Care | Payer: No Typology Code available for payment source | Attending: Emergency Medicine | Admitting: Emergency Medicine

## 2023-10-18 ENCOUNTER — Emergency Department (HOSPITAL_COMMUNITY): Payer: No Typology Code available for payment source

## 2023-10-18 ENCOUNTER — Encounter (HOSPITAL_COMMUNITY): Payer: Self-pay

## 2023-10-18 DIAGNOSIS — S52611A Displaced fracture of right ulna styloid process, initial encounter for closed fracture: Secondary | ICD-10-CM | POA: Diagnosis not present

## 2023-10-18 DIAGNOSIS — W010XXA Fall on same level from slipping, tripping and stumbling without subsequent striking against object, initial encounter: Secondary | ICD-10-CM | POA: Diagnosis not present

## 2023-10-18 DIAGNOSIS — S52501A Unspecified fracture of the lower end of right radius, initial encounter for closed fracture: Secondary | ICD-10-CM | POA: Insufficient documentation

## 2023-10-18 DIAGNOSIS — S0081XA Abrasion of other part of head, initial encounter: Secondary | ICD-10-CM | POA: Insufficient documentation

## 2023-10-18 DIAGNOSIS — S6991XA Unspecified injury of right wrist, hand and finger(s), initial encounter: Secondary | ICD-10-CM | POA: Diagnosis present

## 2023-10-18 DIAGNOSIS — S62101A Fracture of unspecified carpal bone, right wrist, initial encounter for closed fracture: Secondary | ICD-10-CM

## 2023-10-18 MED ORDER — BUPIVACAINE HCL 0.5 % IJ SOLN: 20.0000 mL | Freq: Once | INTRAMUSCULAR | Status: DC

## 2023-10-18 MED ORDER — HYDROMORPHONE HCL 1 MG/ML IJ SOLN
0.5000 mg | Freq: Once | INTRAMUSCULAR | Status: AC
  Administered 2023-10-18: 0.5 mg via INTRAMUSCULAR
  Filled 2023-10-18: qty 1

## 2023-10-18 MED ORDER — HYDROCODONE-ACETAMINOPHEN 5-325 MG PO TABS
1.0000 | ORAL_TABLET | Freq: Once | ORAL | Status: AC
  Administered 2023-10-18: 1 via ORAL
  Filled 2023-10-18: qty 1

## 2023-10-18 MED ORDER — HYDROMORPHONE HCL 1 MG/ML IJ SOLN
1.0000 mg | Freq: Once | INTRAMUSCULAR | Status: AC
  Administered 2023-10-18: 0.5 mg via INTRAMUSCULAR
  Filled 2023-10-18: qty 1

## 2023-10-18 MED ORDER — BUPIVACAINE HCL (PF) 0.5 % IJ SOLN
20.0000 mL | Freq: Once | INTRAMUSCULAR | Status: AC
  Administered 2023-10-18: 20 mL
  Filled 2023-10-18: qty 30

## 2023-10-18 MED ORDER — HYDROCODONE-ACETAMINOPHEN 5-325 MG PO TABS: 1.0000 | ORAL_TABLET | Freq: Four times a day (QID) | ORAL | 0 refills | Status: AC | PRN

## 2023-10-18 NOTE — ED Triage Notes (Signed)
Per EMS, Pt c/o R wrist pain/injury r/t a trip and fall.  Pain score 10/10.  Wrist is splinted, but EMS reports a deformity.

## 2023-10-18 NOTE — ED Provider Notes (Signed)
  Physical Exam  BP (!) 110/56   Pulse 84   Temp 98.1 F (36.7 C) (Oral)   Resp (!) 22   Ht 1.575 m (5\' 2" )   Wt 49.9 kg   LMP 12/03/2007   SpO2 100%   BMI 20.12 kg/m   Physical Exam  Procedures  .Ortho Injury Treatment  Date/Time: 10/18/2023 2:27 PM  Performed by: Lorre Nick, MD Authorized by: Lorre Nick, MD   Consent:    Consent obtained:  Verbal   Consent given by:  Patient   Risks discussed:  Fracture   Alternatives discussed:  No treatmentInjury location: wrist Location details: right wrist Injury type: fracture Fracture type: distal radius and ulnar styloid Pre-procedure neurovascular assessment: neurovascularly intact Pre-procedure distal perfusion: normal Pre-procedure neurological function: normal Pre-procedure range of motion: reduced Anesthesia: hematoma block  Anesthesia: Local anesthesia used: yes Local Anesthetic: bupivacaine 0.25% without epinephrine Anesthetic total: 12 mL  Patient sedated: NoManipulation performed: yes Skin traction used: no Skeletal traction used: no Reduction successful: yes X-ray confirmed reduction: yes Immobilization: splint Splint type: sugar tong Splint Applied by: Ortho Tech Supplies used: cotton padding and plaster Post-procedure neurovascular assessment: post-procedure neurovascularly intact Post-procedure distal perfusion: normal Post-procedure neurological function: normal Post-procedure range of motion: improved     ED Course / MDM    Medical Decision Making Amount and/or Complexity of Data Reviewed Radiology: ordered.  Risk Prescription drug management.          Lorre Nick, MD 10/18/23 [phone]

## 2023-10-18 NOTE — Discharge Instructions (Addendum)
You were seen in the emergency room for wrist fracture.  I have sent pain medication to your pharmacy please take as needed.  You can also take Tylenol and ibuprofen. Use Norco for breakthrough pain. Please call [facility], Dr Yehuda Budd for follow up. Please keep abrasions clean, dry and covered. You can apply topical bacitracin or neosporin over wounds.  Return to emergency room with new or worsening symptoms.

## 2023-10-18 NOTE — Progress Notes (Signed)
Orthopedic Tech Progress Note Patient Details:  Allison Beck [birth year] [id number]  Ortho Devices Type of Ortho Device: Sugartong splint Ortho Device/Splint Location: RUE Ortho Device/Splint Interventions: Ordered, Application, Adjustment   Post Interventions Patient Tolerated: Well Instructions Provided: Care of device  [name] 10/18/2023, 2:19 PM

## 2023-10-18 NOTE — ED Provider Notes (Signed)
[place] EMERGENCY DEPARTMENT AT [facility] Provider Note   CSN: [id number] Arrival date & time: 10/18/23  1113     History {Add pertinent medical, surgical, social history, OB history to HPI:1} Chief Complaint  Patient presents with   Fall   Wrist Injury    Allison Beck is a 62 y.o. female osteoporosis, vitamin D deficiency presenting to emergency room after fall.  Patient reports she was walking and tripped on her concern while falling forward catching herself with both arms.  She reports falling on outstretched hand and arm having immediate right sided wrist pain.  Patient hit face on the ground reports been able to brace herself with hands and stomach.  Denies any headache, change in vision blurry vision or neck pain.  Patient is not on blood thinners and no loss of consciousness.  No prior surgeries to this wrist.   Fall  Wrist Injury      Home Medications Prior to Admission medications   Medication Sig Start Date End Date Taking? Authorizing Provider  b complex vitamins tablet Take 1 tablet by mouth daily. Shaklee    [provider]  Cholecalciferol (VITAMIN D3) 50 MCG (2000 UT) TABS Take 2,000 Units by mouth in the morning and at bedtime. Shaklee    [provider]  famotidine (PEPCID) 40 MG tablet Take 1 tablet (40 mg total) by mouth daily. 04/10/21   Kozlow, Alvira Philips, MD  ibandronate (BONIVA) 150 MG tablet Take 1 tablet (150 mg total) by mouth every 30 (thirty) days. Take in the morning with a full glass of water, on an empty stomach, and do not take anything else by mouth or lie down for the next 30 min. 08/19/23   Rodolph Bong, MD  Lecithin GRAN Take 6.2 g by mouth daily. Shaklee    [provider]  Multiple Vitamins-Minerals (MULTIVITAMIN WITH MINERALS) tablet Take 1 tablet by mouth in the morning and at bedtime. Vitalee gold with Vitamin K Shaklee    [provider]  OVER THE COUNTER MEDICATION Take 2 tablets by  mouth in the morning and at bedtime. Omega Guard/ Shaklee Omega 3 fatty acid 667 mg EPA 363 mg DHA 240 mg    [provider]  OVER THE COUNTER MEDICATION Take 1 tablet by mouth in the morning and at bedtime. Nutriferon Immunity    [provider]  OVER THE COUNTER MEDICATION Take 2-3 tablets by mouth daily. Pain relief complex Shaklee    [provider]  oxyCODONE (OXY IR/ROXICODONE) 5 MG immediate release tablet Take 5 mg by mouth every 4 (four) hours as needed.    [provider]  Turmeric 500 MG CAPS Take 1,500 mg by mouth daily. Black pepper abstract/Shaklee    [provider]  Zinc Sulfate (ZINC 15 PO) Take 15 mg by mouth daily. Shaklee    [provider]      Allergies    Asa [aspirin], Alendronate sodium, Omnicef [cefdinir], Doxycycline, Naproxen, Penicillins, and Sulfa antibiotics    Review of Systems   Review of Systems  Musculoskeletal:  Positive for joint swelling.    Physical Exam Updated Vital Signs BP 101/89 (BP Location: Right Arm)   Pulse 75   Temp 97.7 F (36.5 C) (Oral)   Resp 18   Ht 5\' 2"  (1.575 m)   Wt 49.9 kg   LMP 12/03/2007   SpO2 97%   BMI 20.12 kg/m  Physical Exam Vitals and nursing note reviewed.  Constitutional:  General: She is not in acute distress.    Appearance: She is not toxic-appearing.  HENT:     Head: Normocephalic and atraumatic.  Eyes:     General: No scleral icterus.    Conjunctiva/sclera: Conjunctivae normal.  Cardiovascular:     Rate and Rhythm: Normal rate and regular rhythm.     Pulses: Normal pulses.     Heart sounds: Normal heart sounds.  Pulmonary:     Effort: Pulmonary effort is normal. No respiratory distress.     Breath sounds: Normal breath sounds.  Abdominal:     General: Abdomen is flat. Bowel sounds are normal.     Palpations: Abdomen is soft.     Tenderness: There is no abdominal tenderness.  Musculoskeletal:        General: Swelling and deformity  present.     Right lower leg: No edema.     Left lower leg: No edema.     Comments: Right wrist  Neurovascularly intact  Skin:    General: Skin is warm and dry.     Findings: No lesion.     Comments: Bilateral small superficial abrasion over palms and right side of face   Neurological:     General: No focal deficit present.     Mental Status: She is alert and oriented to person, place, and time. Mental status is at baseline.     ED Results / Procedures / Treatments   Labs (all labs ordered are listed, but only abnormal results are displayed) Labs Reviewed - No data to display  EKG None  Radiology No results found.  Procedures Procedures  {Document cardiac monitor, telemetry assessment procedure when appropriate:1}  Medications Ordered in ED Medications - No data to display  ED Course/ Medical Decision Making/ A&P   {   Click here for ABCD2, HEART and other calculatorsREFRESH Note before signing :1}                              Medical Decision Making Amount and/or Complexity of Data Reviewed Radiology: ordered.  Risk Prescription drug management.   This patient presents to the ED for concern of right wrist pain, this involves an extensive number of treatment options, and is a complaint that carries with it a high risk of complications and morbidity.  The differential diagnosis includes fracture, dislocation,    Co morbidities that complicate the patient evaluation  Osteoporosis    Additional history obtained:  None   Lab Tests:  None    Imaging Studies ordered:  I ordered imaging studies including right wrist   I independently visualized and interpreted imaging which showed obvious radius and ulna fracture with dorsal displacement and angulation I agree with the radiologist interpretation   Cardiac Monitoring: / EKG:  The patient was maintained on a cardiac monitor.   Consultations Obtained:  None   Problem List / ED Course / Critical  interventions / Medication management  Patient reporting to emergency room after fall injuring her right wrist.  Patient also reports she hit her head with no loss of consciousness or confusion.  Patient is answering questions appropriately with no other associated symptoms.  Given Norco for pain control, will inject with bupivacaine for relocation with Dr. Freida Busman.  I ordered medication including norco  for pain  Reevaluation of the patient after these medicines showed that the patient improved I have reviewed the patients home medicines and have made adjustments as needed  Plan  F/u w/ PCP in 2-3d to ensure resolution of sx.  Patient was given return precautions. Patient stable for discharge at this time.  Patient educated on sx/dx and verbalized understanding of plan. Return to ER w/ new or worsening sx.    {Document critical care time when appropriate:1} {Document review of labs and clinical decision tools ie heart score, Chads2Vasc2 etc:1}  {Document your independent review of radiology images, and any outside records:1} {Document your discussion with family members, caretakers, and with consultants:1} {Document social determinants of health affecting pt's care:1} {Document your decision making why or why not admission, treatments were needed:1} Final Clinical Impression(s) / ED Diagnoses Final diagnoses:  Closed fracture of right wrist, initial encounter    Rx / DC Orders ED Discharge Orders     None

## 2023-10-18 NOTE — ED Provider Notes (Signed)
I provided a substantive portion of the care of this patient.  I personally made/approved the management plan for this patient and take responsibility for the patient management.      Patient here after mechanical fall just prior to arrival.  Evidence of right distal radius fracture.  Joint reduced.  Please see my procedure note for this.  Postreduction films show good alignment.  Will give Ortho referral   Lorre Nick, MD 10/18/23 1430

## 2023-10-23 ENCOUNTER — Encounter (HOSPITAL_COMMUNITY): Payer: Self-pay | Admitting: Orthopedic Surgery

## 2023-10-23 NOTE — Progress Notes (Addendum)
SDW call  Patient was given pre-op instructions over the phone. Patient verbalized understanding of instructions provided.     PCP - Sherrie Mustache, PA-C Cardiologist -  Pulmonary:    PPM/ICD - denies Device Orders - na Rep Notified - na   Chest x-ray - na EKG -  na Stress Test - ECHO -  Cardiac Cath -   Sleep Study/sleep apnea/CPAP: denies  Non-diabetic   Blood Thinner Instructions:denies  Aspirin Instructions:denies   ERAS Protcol - Clears until 1400   COVID TEST- na    Anesthesia review: No   Patient denies shortness of breath, fever, cough and chest pain over the phone call  Your procedure is scheduled on Friday, October 24, 2023  Call the OR front desk at [phone]. Call this number if you have problems the morning of surgery:  [phone]   If you have any questions prior to your surgery date call [phone]: Open Monday-Friday 8am-4pm If you experience any cold or flu symptoms such as cough, fever, chills, shortness of breath, etc. between now and your scheduled surgery, please notify us at the above number    Remember:  Do not eat or drink after midnight the night before your surgery  Take these medicines as needed the morning of surgery with A SIP OF WATER:  Norco  As of today, STOP taking any Aspirin (unless otherwise instructed by your surgeon) Aleve, Naproxen, Ibuprofen, Motrin, Advil, Goody's, BC's, all herbal medications, fish oil, and all vitamins.

## 2023-10-24 ENCOUNTER — Encounter (HOSPITAL_COMMUNITY)
Admission: RE | Disposition: A | Discharge status: Home / Self Care | Payer: Self-pay | Source: Home / Self Care | Attending: Orthopedic Surgery

## 2023-10-24 ENCOUNTER — Ambulatory Visit (HOSPITAL_COMMUNITY): Payer: No Typology Code available for payment source

## 2023-10-24 ENCOUNTER — Encounter (HOSPITAL_COMMUNITY): Payer: Self-pay | Admitting: Orthopedic Surgery

## 2023-10-24 ENCOUNTER — Other Ambulatory Visit: Payer: Self-pay

## 2023-10-24 ENCOUNTER — Ambulatory Visit (HOSPITAL_COMMUNITY)
Admission: RE | Admit: 2023-10-24 | Discharge: 2023-10-24 | Disposition: A | Discharge status: Home / Self Care | Payer: No Typology Code available for payment source | Attending: Orthopedic Surgery | Admitting: Orthopedic Surgery

## 2023-10-24 DIAGNOSIS — S52692A Other fracture of lower end of left ulna, initial encounter for closed fracture: Secondary | ICD-10-CM | POA: Diagnosis not present

## 2023-10-24 DIAGNOSIS — S52501A Unspecified fracture of the lower end of right radius, initial encounter for closed fracture: Secondary | ICD-10-CM

## 2023-10-24 DIAGNOSIS — S52571A Other intraarticular fracture of lower end of right radius, initial encounter for closed fracture: Secondary | ICD-10-CM | POA: Diagnosis present

## 2023-10-24 DIAGNOSIS — X58XXXA Exposure to other specified factors, initial encounter: Secondary | ICD-10-CM | POA: Diagnosis not present

## 2023-10-24 HISTORY — DX: Other specified postprocedural states: Z98.890

## 2023-10-24 HISTORY — PX: OPEN REDUCTION INTERNAL FIXATION (ORIF) DISTAL RADIAL FRACTURE: SHX5989

## 2023-10-24 HISTORY — DX: Other complications of anesthesia, initial encounter: T88.59XA

## 2023-10-24 LAB — CBC
HCT: 43.1 % (ref 36.0–46.0)
Hemoglobin: 14 g/dL (ref 12.0–15.0)
MCH: 29.5 pg (ref 26.0–34.0)
MCHC: 32.5 g/dL (ref 30.0–36.0)
MCV: 90.9 fL (ref 80.0–100.0)
Platelets: 264 10*3/uL (ref 150–400)
RBC: 4.74 MIL/uL (ref 3.87–5.11)
RDW: 12.7 % (ref 11.5–15.5)
WBC: 4.7 10*3/uL (ref 4.0–10.5)
nRBC: 0 % (ref 0.0–0.2)

## 2023-10-24 SURGERY — OPEN REDUCTION INTERNAL FIXATION (ORIF) DISTAL RADIUS FRACTURE
Anesthesia: Monitor Anesthesia Care | Site: Arm Lower | Laterality: Right

## 2023-10-24 MED ORDER — BUPIVACAINE HCL (PF) 0.25 % IJ SOLN
INTRAMUSCULAR | Status: AC
  Filled 2023-10-24: qty 30

## 2023-10-24 MED ORDER — FENTANYL CITRATE (PF) 100 MCG/2ML IJ SOLN: 25.0000 ug | INTRAMUSCULAR | Status: DC | PRN

## 2023-10-24 MED ORDER — ONDANSETRON HCL 4 MG/2ML IJ SOLN
INTRAMUSCULAR | Status: DC | PRN
  Administered 2023-10-24: 4 mg via INTRAVENOUS

## 2023-10-24 MED ORDER — ORAL CARE MOUTH RINSE: 15.0000 mL | Freq: Once | OROMUCOSAL | Status: AC

## 2023-10-24 MED ORDER — PROPOFOL 10 MG/ML IV BOLUS
INTRAVENOUS | Status: DC | PRN
  Administered 2023-10-24: 100 mg via INTRAVENOUS

## 2023-10-24 MED ORDER — 0.9 % SODIUM CHLORIDE (POUR BTL) OPTIME
TOPICAL | Status: DC | PRN
  Administered 2023-10-24: 1000 mL

## 2023-10-24 MED ORDER — PROPOFOL 10 MG/ML IV BOLUS
INTRAVENOUS | Status: AC
  Filled 2023-10-24: qty 20

## 2023-10-24 MED ORDER — FENTANYL CITRATE (PF) 250 MCG/5ML IJ SOLN
INTRAMUSCULAR | Status: AC
  Filled 2023-10-24: qty 5

## 2023-10-24 MED ORDER — TRIPLE ANTIBIOTIC 3.5-400-5000 EX OINT
TOPICAL_OINTMENT | CUTANEOUS | Status: DC | PRN
  Administered 2023-10-24: 1 via TOPICAL

## 2023-10-24 MED ORDER — PHENYLEPHRINE 80 MCG/ML (10ML) SYRINGE FOR IV PUSH (FOR BLOOD PRESSURE SUPPORT)
PREFILLED_SYRINGE | INTRAVENOUS | Status: DC | PRN
  Administered 2023-10-24 (×3): 80 ug via INTRAVENOUS

## 2023-10-24 MED ORDER — LIDOCAINE 2% (20 MG/ML) 5 ML SYRINGE
INTRAMUSCULAR | Status: AC
  Filled 2023-10-24: qty 5

## 2023-10-24 MED ORDER — DEXAMETHASONE SODIUM PHOSPHATE 10 MG/ML IJ SOLN
INTRAMUSCULAR | Status: DC | PRN
  Administered 2023-10-24: 4 mg via INTRAVENOUS

## 2023-10-24 MED ORDER — ROPIVACAINE HCL 5 MG/ML IJ SOLN
INTRAMUSCULAR | Status: DC | PRN
  Administered 2023-10-24: 33 mL via PERINEURAL

## 2023-10-24 MED ORDER — MIDAZOLAM HCL 2 MG/2ML IJ SOLN
INTRAMUSCULAR | Status: AC
  Filled 2023-10-24: qty 2

## 2023-10-24 MED ORDER — DEXAMETHASONE SODIUM PHOSPHATE 4 MG/ML IJ SOLN
INTRAMUSCULAR | Status: DC | PRN
  Administered 2023-10-24: 5 mg via PERINEURAL

## 2023-10-24 MED ORDER — LACTATED RINGERS IV SOLN: INTRAVENOUS | Status: DC | PRN

## 2023-10-24 MED ORDER — DEXAMETHASONE SODIUM PHOSPHATE 10 MG/ML IJ SOLN
INTRAMUSCULAR | Status: AC
  Filled 2023-10-24: qty 1

## 2023-10-24 MED ORDER — PROPOFOL 500 MG/50ML IV EMUL
INTRAVENOUS | Status: DC | PRN
  Administered 2023-10-24: 100 ug/kg/min via INTRAVENOUS

## 2023-10-24 MED ORDER — CHLORHEXIDINE GLUCONATE 0.12 % MT SOLN
15.0000 mL | Freq: Once | OROMUCOSAL | Status: AC
  Administered 2023-10-24: 15 mL via OROMUCOSAL
  Filled 2023-10-24: qty 15

## 2023-10-24 MED ORDER — CLONIDINE HCL (ANALGESIA) 100 MCG/ML EP SOLN
EPIDURAL | Status: DC | PRN
  Administered 2023-10-24: 80 ug

## 2023-10-24 MED ORDER — ONDANSETRON HCL 4 MG/2ML IJ SOLN
INTRAMUSCULAR | Status: AC
  Filled 2023-10-24: qty 2

## 2023-10-24 MED ORDER — FENTANYL CITRATE (PF) 100 MCG/2ML IJ SOLN
INTRAMUSCULAR | Status: DC | PRN
  Administered 2023-10-24 (×2): 25 ug via INTRAVENOUS

## 2023-10-24 MED ORDER — ACETAMINOPHEN 500 MG PO TABS
1000.0000 mg | ORAL_TABLET | Freq: Once | ORAL | Status: AC
  Administered 2023-10-24: 1000 mg via ORAL
  Filled 2023-10-24: qty 2

## 2023-10-24 MED ORDER — LIDOCAINE 2% (20 MG/ML) 5 ML SYRINGE
INTRAMUSCULAR | Status: DC | PRN
  Administered 2023-10-24: 50 mg via INTRAVENOUS

## 2023-10-24 MED ORDER — EPHEDRINE SULFATE-NACL 50-0.9 MG/10ML-% IV SOSY
PREFILLED_SYRINGE | INTRAVENOUS | Status: DC | PRN
  Administered 2023-10-24 (×2): 10 mg via INTRAVENOUS

## 2023-10-24 MED ORDER — FENTANYL CITRATE (PF) 100 MCG/2ML IJ SOLN
INTRAMUSCULAR | Status: AC
  Administered 2023-10-24: 50 ug
  Filled 2023-10-24: qty 2

## 2023-10-24 MED ORDER — VANCOMYCIN HCL IN DEXTROSE 1-5 GM/200ML-% IV SOLN
1000.0000 mg | INTRAVENOUS | Status: AC
  Administered 2023-10-24: 1000 mg via INTRAVENOUS
  Filled 2023-10-24: qty 200

## 2023-10-24 MED ORDER — BACITRACIN-NEOMYCIN-POLYMYXIN OINTMENT TUBE
TOPICAL_OINTMENT | CUTANEOUS | Status: AC
  Filled 2023-10-24: qty 14.17

## 2023-10-24 MED ORDER — LACTATED RINGERS IV SOLN
INTRAVENOUS | Status: DC
Start: 2023-10-24 — End: 2023-10-25

## 2023-10-24 MED ORDER — FENTANYL CITRATE (PF) 100 MCG/2ML IJ SOLN: 50.0000 ug | Freq: Once | INTRAMUSCULAR | Status: DC

## 2023-10-24 MED ORDER — MIDAZOLAM HCL 5 MG/5ML IJ SOLN
INTRAMUSCULAR | Status: DC | PRN
  Administered 2023-10-24: 2 mg via INTRAVENOUS

## 2023-10-24 SURGICAL SUPPLY — 55 items
BAG COUNTER SPONGE SURGICOUNT (BAG) ×1 IMPLANT
BIT DRILL 2.2 SS TIBIAL (BIT) IMPLANT
BLADE CLIPPER SURG (BLADE) IMPLANT
BNDG ELASTIC 3INX 5YD STR LF (GAUZE/BANDAGES/DRESSINGS) ×3 IMPLANT
BNDG ELASTIC 4INX 5YD STR LF (GAUZE/BANDAGES/DRESSINGS) IMPLANT
BNDG ELASTIC 4X5.8 VLCR STR LF (GAUZE/BANDAGES/DRESSINGS) ×1 IMPLANT
BNDG ESMARK 4X9 LF (GAUZE/BANDAGES/DRESSINGS) ×1 IMPLANT
BNDG GAUZE DERMACEA FLUFF 4 (GAUZE/BANDAGES/DRESSINGS) ×3 IMPLANT
CORD BIPOLAR FORCEPS 12FT (ELECTRODE) ×1 IMPLANT
COVER SURGICAL LIGHT HANDLE (MISCELLANEOUS) ×1 IMPLANT
CUFF TOURN SGL QUICK 18X4 (TOURNIQUET CUFF) ×1 IMPLANT
CUFF TRNQT CYL 24X4X16.5-23 (TOURNIQUET CUFF) IMPLANT
DRAIN TLS ROUND 10FR (DRAIN) IMPLANT
DRAPE OEC MINIVIEW 54X84 (DRAPES) IMPLANT
DRAPE U-SHAPE 47X51 STRL (DRAPES) ×1 IMPLANT
DRSG ADAPTIC 3X8 NADH LF (GAUZE/BANDAGES/DRESSINGS) ×1 IMPLANT
GAUZE SPONGE 4X4 12PLY STRL (GAUZE/BANDAGES/DRESSINGS) ×1 IMPLANT
GAUZE XEROFORM 5X9 LF (GAUZE/BANDAGES/DRESSINGS) ×1 IMPLANT
GLOVE BIOGEL M 8.0 STRL (GLOVE) ×1 IMPLANT
GLOVE SS BIOGEL STRL SZ 8 (GLOVE) ×1 IMPLANT
GOWN STRL REUS W/ TWL LRG LVL3 (GOWN DISPOSABLE) ×3 IMPLANT
GOWN STRL REUS W/ TWL XL LVL3 (GOWN DISPOSABLE) ×3 IMPLANT
K-WIRE FX5X1.6XNS BN SS (WIRE) ×2
KIT BASIN OR (CUSTOM PROCEDURE TRAY) ×1 IMPLANT
KIT TURNOVER KIT B (KITS) ×1 IMPLANT
KWIRE FX5X1.6XNS BN SS (WIRE) IMPLANT
MANIFOLD NEPTUNE II (INSTRUMENTS) ×1 IMPLANT
NDL 22X1.5 STRL (OR ONLY) (MISCELLANEOUS) IMPLANT
NEEDLE 22X1.5 STRL (OR ONLY) (MISCELLANEOUS) IMPLANT
NS IRRIG 1000ML POUR BTL (IV SOLUTION) ×1 IMPLANT
PACK ORTHO EXTREMITY (CUSTOM PROCEDURE TRAY) ×1 IMPLANT
PAD ARMBOARD 7.5X6 YLW CONV (MISCELLANEOUS) ×2 IMPLANT
PAD CAST 4YDX4 CTTN HI CHSV (CAST SUPPLIES) ×3 IMPLANT
PADDING CAST ABS COTTON 4X4 ST (CAST SUPPLIES) IMPLANT
PEG LOCKING SMOOTH 2.2X18 (Peg) IMPLANT
PEG LOCKING SMOOTH 2.2X20 (Screw) IMPLANT
PLATE DVR VOLAR RIM NARROW RT (Plate) IMPLANT
PUTTY DBM STAGRAFT PLUS 5CC (Putty) IMPLANT
SCREW LOCK 14X2.7X 3 LD TPR (Screw) IMPLANT
SCREW LOCKING 2.7X13MM (Screw) IMPLANT
SOL PREP POV-IOD 4OZ 10% (MISCELLANEOUS) ×2 IMPLANT
SPIKE FLUID TRANSFER (MISCELLANEOUS) IMPLANT
SPLINT FIBERGLASS 3X12 (CAST SUPPLIES) IMPLANT
SPONGE T-LAP 4X18 ~~LOC~~+RFID (SPONGE) IMPLANT
SUT MNCRL AB 4-0 PS2 18 (SUTURE) ×1 IMPLANT
SUT PROLENE 3 0 PS 2 (SUTURE) IMPLANT
SUT PROLENE 4 0 PS 2 18 (SUTURE) IMPLANT
SUT VIC AB 3-0 FS2 27 (SUTURE) IMPLANT
SYR CONTROL 10ML LL (SYRINGE) IMPLANT
TOWEL GREEN STERILE (TOWEL DISPOSABLE) ×1 IMPLANT
TOWEL GREEN STERILE FF (TOWEL DISPOSABLE) ×1 IMPLANT
TUBE CONNECTING 12X1/4 (SUCTIONS) ×1 IMPLANT
TUBE EVACUATION TLS (MISCELLANEOUS) ×1 IMPLANT
UNDERPAD 30X36 HEAVY ABSORB (UNDERPADS AND DIAPERS) ×1 IMPLANT
WATER STERILE IRR 1000ML POUR (IV SOLUTION) ×1 IMPLANT

## 2023-10-24 NOTE — Op Note (Signed)
Operative note-- date of dictation and operation 10/24/2023.  Dominica Severin MD  Preoperative diagnosis: Right distal radius fracture comminuted complex greater than 3 part intra-articular.  Comminuted left ulna distal  metaphyseal fracture  Postop diagnosis: Same  Procedure: #1 open reduction internal fixation comminuted complex distal radius fracture with DVR [employer] cross lock plate and screw construct.  This was a greater than 3 part intra-articular fracture.  #2 AP lateral and oblique x-rays performed examined and interpreted by myself #3 brachioradialis tenotomy #4 closed treatment ulna distal metaphyseal fracture comminuted in nature and impacted with stability on reduction and testing  Clemens Lachman MD  Anesthesia: Block with IV sedation  Estimated blood loss minimal  Complications none immediate  Operative indications the patient presents for evaluation and surgical care.  Patient understands risk benefits and desires to proceed.  We have discussed with the patient all issues plans and concerns with this in mind we will proceed accordingly. We are planning surgery for your upper extremity. The risk and benefits of surgery to include risk of bleeding, infection, anesthesia,  damage to normal structures and failure of the surgery to accomplish its intended goals of relieving symptoms and restoring function have been discussed in detail. With this in mind we plan to proceed. I have specifically discussed with the patient the pre-and postoperative regime and the dos and don'ts and risk and benefits in great detail. Risk and benefits of surgery also include risk of dystrophy(CRPS), chronic nerve pain, failure of the healing process to go onto completion and other inherent risks of surgery The relavent the pathophysiology of the disease/injury process, as well as the alternatives for treatment and postoperative course of action has been discussed in great detail with the patient who desires to  proceed.  We will do everything in our power to help you (the patient) restore function to the upper extremity. It is a pleasure to see this patient today.    Operative procedure: Patient was seen by myself and anesthesia.  Appropriate anesthesia was induced and following this the patient was prepped with a Hibiclens pre-scrub followed by 10-minute surgical Betadine scrub and paint.  Once this was completed the extremity was elevated and the tourniquet was insufflated to 250 mmHg.  Timeout was observed preoperative antibiotics were given and the patient then underwent a very careful and cautious approach to the extremity with volar radial incision under 250 mm tourniquet control.  FCR tendon sheath was identified and dissected.  There were no complicating features.  Once this was completed the carpal canal contents were retracted ulnarly and the FCR was retracted radially.  We took very meticulous care of the radial artery and the carpal canal contents during the approach.  The pronator was accessed incised and lifted off of the fracture.  The fracture was then reassembled with standard orthopedic equipment and a DVR plate and screw construct from [employer] was accomplished in terms of placement and fixation of the fracture.  Adequate radial height, volar tilt and radial inclination was restored.  The distal radial ulnar joint, radiocarpal and midcarpal joints all were  stable and satisfactory.  We irrigated copiously and closed the pronator with 3-0 Vicryl followed by closure of the skin edge with Prolene. During the course of the operation the patient had very careful and cautious thought regarding the ulna.  It was impacted and stable in the distal radial ulnar joint looked excellent after the ORIF of the radius.  Given the close reduction of the ulna and its stable  impacted nature as well as the comminution we chose the route of no hardware in the ulna as it was stable.  The patient did have  significant fracture blebs and blisters proximal to the incision in the forearm and thus I did want to limit dissection as well.  The skin was stable and we did not operate through any fracture blisters.  The degree of comminution was rather impressive.  She is quite osteopenic for a 62 year old.  We did place 3 to 4 cc of sta-graft bone graft in the metaphyseal deficit Once again, the distal radius underwent open reduction internal fixation without complications.  The distal radial ulnar joint was stable.  The patient had no complications.  All radiographic parameters look quite well following the fixation.  Standard dressing of Adaptic Xeroform 4 x 4's gauze web roll Kerlix and a volar splint were applied.  The patient understands instructions of elevate move massage fingers notify us any problems occur and follow-up care according to our standard protocol for a DVR plate and screw construct.  It has been a pleasure participate in the patient's care and we look forward to spent in the patient's recovery. I discussed all issues with the patient's family.  They have my cellular phone for any issues problems or concerns.  Standard postop algorithm.  Immobilization for 4 weeks then removable brace and very gentle motion between weeks 4 through 6 at 6 to 8 weeks more pronation supination activities will be tolerated and unrestricted in 8 to 10 weeks strengthening predicated upon the x-ray evaluation and stability Dominica Severin MD

## 2023-10-24 NOTE — H&P (Signed)
Allison Beck is an 62 y.o. female.   Chief Complaint: Right distal radius and ulna fractures displaced in nature.  Plan ORIF HPI: Patient presents for surgical intervention right wrist.  Patient presents for evaluation and treatment of the of their upper extremity predicament. The patient denies neck, back, chest or  abdominal pain. The patient notes that they have no lower extremity problems. The patients primary complaint is noted. We are planning surgical care pathway for the upper extremity.   Past Medical History:  Diagnosis Date  . Anemia   . Arthritis    osteoporosis  . Asthma    no problems, no inhaler  . Complication of anesthesia   . GERD (gastroesophageal reflux disease)    diet controlled, no meds  . Osteopenia    2013 DEXA  . Pneumonia 1996  . PONV (postoperative nausea and vomiting)   . Vitamin D deficiency     Past Surgical History:  Procedure Laterality Date  . ADENOIDECTOMY    . BACK SURGERY  2010   Fusion   . COLONOSCOPY    . KNEE ARTHROSCOPY Right   . OPEN REDUCTION INTERNAL FIXATION (ORIF) DISTAL RADIAL FRACTURE Left 05/16/2020   Procedure: OPEN REDUCTION INTERNAL FIXATION (ORIF) DISTAL RADIUS FRACTURE AND REPAIR AS NECESSARY;  Surgeon: Dominica Severin, MD;  Location: [facility] OR;  Service: Orthopedics;  Laterality: Left;  Block with IV Sed 90 mins  . PARTIAL HYSTERECTOMY  2009  . TONSILLECTOMY     Childhood    Family History  Problem Relation Age of Onset  . Heart disease Mother   . Stroke Mother   . Depression Mother   . Hypertension Mother   . Alzheimer's disease Father   . Hypertension Sister    Social History:  reports that she has never smoked. She has never used smokeless tobacco. She reports that she does not drink alcohol and does not use drugs.  Allergies:  Allergies  Allergen Reactions  . Asa [Aspirin] Shortness Of Breath  . Alendronate Sodium Other (See Comments)  . Omnicef [Cefdinir] Other (See Comments)    unknown  .  Doxycycline Rash  . Naproxen Rash  . Penicillins Other (See Comments)    unknown  . Sulfa Antibiotics Rash    Medications Prior to Admission  Medication Sig Dispense Refill  . ALFALFA PO Take 10 tablets by mouth in the morning and at bedtime. Alfalfa Complex    . Ascorbic Acid (VITAMIN C PO) Take 1-2 tablets by mouth in the morning and at bedtime.    Marland Kitchen b complex vitamins tablet Take 1 tablet by mouth in the morning, at noon, and at bedtime. Shaklee    . calcium carbonate (TUMS) 500 MG chewable tablet Chew 2-4 tablets by mouth at bedtime.    . Cholecalciferol (VITAMIN D3) 50 MCG (2000 UT) TABS Take 2,000 Units by mouth in the morning and at bedtime. Shaklee    . HYDROcodone-acetaminophen (NORCO/VICODIN) 5-325 MG tablet Take 1 tablet by mouth every 6 (six) hours as needed for severe pain (pain score 7-10). (Patient taking differently: Take 0.5 tablets by mouth every 6 (six) hours as needed (pain.).) 12 tablet 0  . LECITHIN PO Take 2 tablets by mouth in the morning, at noon, and at bedtime.    . Multiple Vitamin (STRESS FORMULA PO) Take 1 capsule by mouth at bedtime. Stress Relief Complex by Shaklee (L-theanine, ashwagandha, beta-sitosterol, and L-tyrosine)    . Multiple Vitamins-Minerals (MULTIVITAMIN WITH MINERALS) tablet Take 1 tablet by mouth in the  morning and at bedtime. Vitalee gold with Vitamin K Shaklee    . NON FORMULARY Take 1 tablet by mouth in the morning, at noon, and at bedtime. GLA Complex (Gamma-linolenic Acid)    . Omega-3 Fatty Acids (FISH OIL) 500 MG CAPS Take by mouth.    Marland Kitchen OVER THE COUNTER MEDICATION Take 2 tablets by mouth 2 (two) times daily with a meal. Omega Guard/ Shaklee Omega 3 fatty acid 667 mg EPA 363 mg DHA 240 mg    . OVER THE COUNTER MEDICATION Take 1 tablet by mouth in the morning and at bedtime. Nutriferon Immunity    . OVER THE COUNTER MEDICATION Take 2-3 tablets by mouth 3 (three) times daily as needed (pain.). Pain relief complex Shaklee    . Turmeric 500  MG CAPS Take 500 mg by mouth daily with lunch.    . Zinc Sulfate (ZINC 15 PO) Take 15 mg by mouth daily. Shaklee    . ibandronate (BONIVA) 150 MG tablet Take 1 tablet (150 mg total) by mouth every 30 (thirty) days. Take in the morning with a full glass of water, on an empty stomach, and do not take anything else by mouth or lie down for the next 30 min. (Patient not taking: Reported on 10/23/2023) 3 tablet 3    No results found for this or any previous visit (from the past 48 hour(s)). DG MINI C-ARM IMAGE ONLY  Result Date: 10/24/2023 There is no interpretation for this exam.  This order is for images obtained during a surgical procedure.  Please See "Surgeries" Tab for more information regarding the procedure.    Review of Systems  Respiratory: Negative.    Cardiovascular: Negative.   Gastrointestinal: Negative.   Endocrine: Negative.   Genitourinary: Negative.    Blood pressure 101/62, pulse 76, temperature 98.4 F (36.9 C), temperature source Oral, resp. rate 13, height 5\' 2"  (1.575 m), weight 49.9 kg, last menstrual period 12/03/2007, SpO2 100%. Physical Exam displaced distal radius and ulna fractures right upper extremity.  Swelling is improved since I saw her in the office but certainly she has a lot of recovery to work on including edema control and fracture fixation etc.  She and her family understand this.  The patient is alert and oriented in no acute distress. The patient complains of pain in the affected upper extremity.  The patient is noted to have a normal HEENT exam. Lung fields show equal chest expansion and no shortness of breath. Abdomen exam is nontender without distention. Lower extremity examination does not show any fracture dislocation or blood clot symptoms. Pelvis is stable and the neck and back are stable and nontender.  She notes no shortness of breath she notes no complicating features of pain or problems in her lower extremities.  We will plan to proceed  with surgical algorithm of care.  Assessment/Plan We will plan for open reduction internal fixation right distal radius and possible ulnar fracture ORIF as/if necessary.  We are planning surgery for your upper extremity. The risk and benefits of surgery to include risk of bleeding, infection, anesthesia,  damage to normal structures and failure of the surgery to accomplish its intended goals of relieving symptoms and restoring function have been discussed in detail. With this in mind we plan to proceed. I have specifically discussed with the patient the pre-and postoperative regime and the dos and don'ts and risk and benefits in great detail. Risk and benefits of surgery also include risk of dystrophy(CRPS), chronic nerve pain,  failure of the healing process to go onto completion and other inherent risks of surgery The relavent the pathophysiology of the disease/injury process, as well as the alternatives for treatment and postoperative course of action has been discussed in great detail with the patient who desires to proceed.  We will do everything in our power to help you (the patient) restore function to the upper extremity. It is a pleasure to see this patient today.   Karen Chafe, MD 10/24/2023, 5:20 PM

## 2023-10-24 NOTE — Anesthesia Procedure Notes (Signed)
Anesthesia Regional Block: Axillary brachial plexus block   Pre-Anesthetic Checklist: , timeout performed,  Correct Patient, Correct Site, Correct Laterality,  Correct Procedure, Correct Position, site marked,  Risks and benefits discussed,  Surgical consent,  Pre-op evaluation,  At surgeon's request and post-op pain management  Laterality: Upper and Right  Prep: chloraprep       Needles:  Injection technique: Single-shot  Needle Type: Stimiplex          Additional Needles:   Procedures:,,,, ultrasound used (permanent image in chart),,    Narrative:  Start time: 10/24/2023 4:32 PM End time: 10/24/2023 4:52 PM Injection made incrementally with aspirations every 5 mL.  Performed by: Personally  Anesthesiologist: Lewie Loron, MD  Additional Notes: BP cuff, SpO2 and EKG monitors applied. Sedation begun. Nerve location verified with ultrasound. Anesthetic injected incrementally, slowly, and after neg aspirations under direct u/s guidance. Good perineural spread. Tolerated well.

## 2023-10-24 NOTE — Anesthesia Preprocedure Evaluation (Addendum)
Anesthesia Evaluation  Patient identified by MRN, date of birth, ID band Patient awake    Reviewed: Allergy & Precautions, NPO status , Patient's Chart, lab work & pertinent test results  History of Anesthesia Complications (+) PONV and history of anesthetic complications  Airway Mallampati: I  TM Distance: >3 FB Neck ROM: Full    Dental no notable dental hx. (+) Dental Advisory Given, Teeth Intact   Pulmonary asthma    Pulmonary exam normal breath sounds clear to auscultation       Cardiovascular negative cardio ROS Normal cardiovascular exam Rhythm:Regular Rate:Normal     Neuro/Psych negative neurological ROS  negative psych ROS   GI/Hepatic Neg liver ROS,GERD  ,,  Endo/Other  negative endocrine ROS    Renal/GU negative Renal ROS  negative genitourinary   Musculoskeletal  (+) Arthritis ,    Abdominal   Peds  Hematology  (+) Blood dyscrasia, anemia   Anesthesia Other Findings   Reproductive/Obstetrics                             Anesthesia Physical Anesthesia Plan  ASA: 2  Anesthesia Plan: MAC and Regional   Post-op Pain Management: Regional block* and Tylenol PO (pre-op)*   Induction: Intravenous  PONV Risk Score and Plan: 3 and Propofol infusion, Treatment may vary due to age or medical condition, Midazolam and Ondansetron  Airway Management Planned: Natural Airway  Additional Equipment:   Intra-op Plan:   Post-operative Plan:   Informed Consent: I have reviewed the patients History and Physical, chart, labs and discussed the procedure including the risks, benefits and alternatives for the proposed anesthesia with the patient or authorized representative who has indicated his/her understanding and acceptance.     Dental advisory given  Plan Discussed with: CRNA  Anesthesia Plan Comments:        Anesthesia Quick Evaluation

## 2023-10-24 NOTE — Discharge Instructions (Signed)
We recommend that you to take vitamin C 1000 mg a day to promote healing. We also recommend that if you require  pain medicine that you take a stool softener to prevent constipation as most pain medicines will have constipation side effects. We recommend either Peri-Colace or Senokot and recommend that you also consider adding MiraLAX as well to prevent the constipation affects from pain medicine if you are required to use them. These medicines are over the counter and may be purchased at a local pharmacy. A cup of yogurt and a probiotic can also be helpful during the recovery process as the medicines can disrupt your intestinal environment. Keep bandage clean and dry.  Call for any problems.  No smoking.  Criteria for driving a car: you should be off your pain medicine for 7-8 hours, able to drive one handed(confident), thinking clearly and feeling able in your judgement to drive. Continue elevation as it will decrease swelling.  If instructed by MD move your fingers within the confines of the bandage/splint.  Use ice if instructed by your MD. Call immediately for any sudden loss of feeling in your hand/arm or change in functional abilities of the extremity.

## 2023-10-24 NOTE — Transfer of Care (Signed)
Immediate Anesthesia Transfer of Care Note  Patient: Allison Beck  Procedure(s) Performed: OPEN REDUCTION INTERNAL FIXATION (ORIF) DISTAL RADIUS FRACTURE AS NECESSARY (Right: Arm Lower)  Patient Location: PACU  Anesthesia Type:MAC and Regional  Level of Consciousness: awake, alert , and oriented  Airway & Oxygen Therapy: Patient Spontanous Breathing and Patient connected to face mask oxygen  Post-op Assessment: Report given to RN and Post -op Vital signs reviewed and stable  Post vital signs: Reviewed and stable  Last Vitals:  Vitals Value Taken Time  BP 95/35 10/24/23 1845  Temp 97.1   Pulse 80 10/24/23 1845  Resp 17 10/24/23 1845  SpO2 100 % 10/24/23 1845  Vitals shown include unfiled device data.  Last Pain:  Vitals:   10/24/23 1537  TempSrc: Oral  PainSc: 3          Complications: No notable events documented.

## 2023-10-26 ENCOUNTER — Encounter (HOSPITAL_COMMUNITY): Payer: Self-pay | Admitting: Orthopedic Surgery

## 2023-10-26 NOTE — Anesthesia Postprocedure Evaluation (Signed)
Anesthesia Post Note  Patient: Allison Beck  Procedure(s) Performed: OPEN REDUCTION INTERNAL FIXATION (ORIF) DISTAL RADIUS FRACTURE AS NECESSARY (Right: Arm Lower)     Patient location during evaluation: PACU Anesthesia Type: Regional and MAC Level of consciousness: awake and alert Pain management: pain level controlled Vital Signs Assessment: post-procedure vital signs reviewed and stable Respiratory status: spontaneous breathing, nonlabored ventilation and respiratory function stable Cardiovascular status: stable and blood pressure returned to baseline Postop Assessment: no apparent nausea or vomiting Anesthetic complications: no   No notable events documented.  Last Vitals:  Vitals:   10/24/23 1900 10/24/23 1915  BP: (!) 111/99 (!) 98/57  Pulse: 69 73  Resp: 15 16  Temp:  36.9 C  SpO2: 97% 96%    Last Pain:  Vitals:   10/24/23 1915  TempSrc:   PainSc: 0-No pain                 Shaquina Gillham

## 2023-10-28 ENCOUNTER — Encounter (HOSPITAL_COMMUNITY): Payer: Self-pay | Admitting: Orthopedic Surgery

## 2024-08-13 ENCOUNTER — Other Ambulatory Visit: Payer: Self-pay | Admitting: Obstetrics and Gynecology

## 2024-08-13 DIAGNOSIS — N6311 Unspecified lump in the right breast, upper outer quadrant: Secondary | ICD-10-CM

## 2024-08-23 ENCOUNTER — Ambulatory Visit
Admission: RE | Admit: 2024-08-23 | Discharge: 2024-08-23 | Disposition: A | Discharge status: Home / Self Care | Source: Ambulatory Visit | Attending: Obstetrics and Gynecology | Admitting: Obstetrics and Gynecology

## 2024-08-23 DIAGNOSIS — N6311 Unspecified lump in the right breast, upper outer quadrant: Secondary | ICD-10-CM
